# Patient Record
Sex: Male | Born: 1980 | Race: White | Hispanic: No | Marital: Single | Smoking: Current every day smoker
Health system: Southern US, Community
[De-identification: ages and names within clinical notes are randomized; demographics above are authoritative.]

## PROBLEM LIST (undated history)

## (undated) DIAGNOSIS — M5020 Other cervical disc displacement, unspecified cervical region: Secondary | ICD-10-CM

## (undated) DIAGNOSIS — F4310 Post-traumatic stress disorder, unspecified: Secondary | ICD-10-CM

## (undated) DIAGNOSIS — M5430 Sciatica, unspecified side: Secondary | ICD-10-CM

## (undated) DIAGNOSIS — S329XXA Fracture of unspecified parts of lumbosacral spine and pelvis, initial encounter for closed fracture: Secondary | ICD-10-CM

---

## 2007-08-30 ENCOUNTER — Emergency Department (HOSPITAL_COMMUNITY): Admission: EM | Admit: 2007-08-30 | Discharge: 2007-08-30 | Payer: Self-pay | Admitting: Emergency Medicine

## 2007-09-22 ENCOUNTER — Emergency Department (HOSPITAL_COMMUNITY): Admission: EM | Admit: 2007-09-22 | Discharge: 2007-09-22 | Payer: Self-pay | Admitting: Emergency Medicine

## 2007-09-25 ENCOUNTER — Emergency Department (HOSPITAL_COMMUNITY): Admission: EM | Admit: 2007-09-25 | Discharge: 2007-09-25 | Payer: Self-pay | Admitting: Emergency Medicine

## 2008-11-29 ENCOUNTER — Emergency Department (HOSPITAL_COMMUNITY): Admission: EM | Admit: 2008-11-29 | Discharge: 2008-11-29 | Payer: Self-pay | Admitting: Emergency Medicine

## 2008-12-20 ENCOUNTER — Emergency Department (HOSPITAL_COMMUNITY): Admission: EM | Admit: 2008-12-20 | Discharge: 2008-12-21 | Payer: Self-pay | Admitting: Emergency Medicine

## 2008-12-31 ENCOUNTER — Emergency Department (HOSPITAL_COMMUNITY): Admission: EM | Admit: 2008-12-31 | Discharge: 2008-12-31 | Payer: Self-pay | Admitting: Emergency Medicine

## 2009-01-03 ENCOUNTER — Emergency Department (HOSPITAL_COMMUNITY): Admission: EM | Admit: 2009-01-03 | Discharge: 2009-01-03 | Payer: Self-pay | Admitting: Emergency Medicine

## 2009-01-19 ENCOUNTER — Emergency Department (HOSPITAL_COMMUNITY): Admission: EM | Admit: 2009-01-19 | Discharge: 2009-01-19 | Payer: Self-pay | Admitting: Emergency Medicine

## 2009-02-09 ENCOUNTER — Emergency Department (HOSPITAL_COMMUNITY): Admission: EM | Admit: 2009-02-09 | Discharge: 2009-02-09 | Payer: Self-pay | Admitting: Emergency Medicine

## 2009-02-25 ENCOUNTER — Emergency Department (HOSPITAL_COMMUNITY): Admission: EM | Admit: 2009-02-25 | Discharge: 2009-02-25 | Payer: Self-pay | Admitting: Emergency Medicine

## 2009-02-27 ENCOUNTER — Emergency Department (HOSPITAL_COMMUNITY): Admission: EM | Admit: 2009-02-27 | Discharge: 2009-02-27 | Payer: Self-pay | Admitting: Emergency Medicine

## 2009-11-07 ENCOUNTER — Emergency Department (HOSPITAL_COMMUNITY): Admission: EM | Admit: 2009-11-07 | Discharge: 2009-11-07 | Payer: Self-pay | Admitting: Emergency Medicine

## 2009-11-28 ENCOUNTER — Emergency Department (HOSPITAL_COMMUNITY): Admission: EM | Admit: 2009-11-28 | Discharge: 2009-11-28 | Payer: Self-pay | Admitting: Emergency Medicine

## 2010-03-08 IMAGING — CR DG HAND COMPLETE 3+V*R*
3 series · 3 of 3 positions shown · non-contrast
Comparison: [HOSPITAL] right wrist and right hand
radiographs 09/22/2007.

CLINICAL DATA: Trauma with soft tissue swelling bruising dorsum of
right hand with pain.

RIGHT HAND - COMPLETE 3+ VIEW

[view not recorded (1 of 3)]
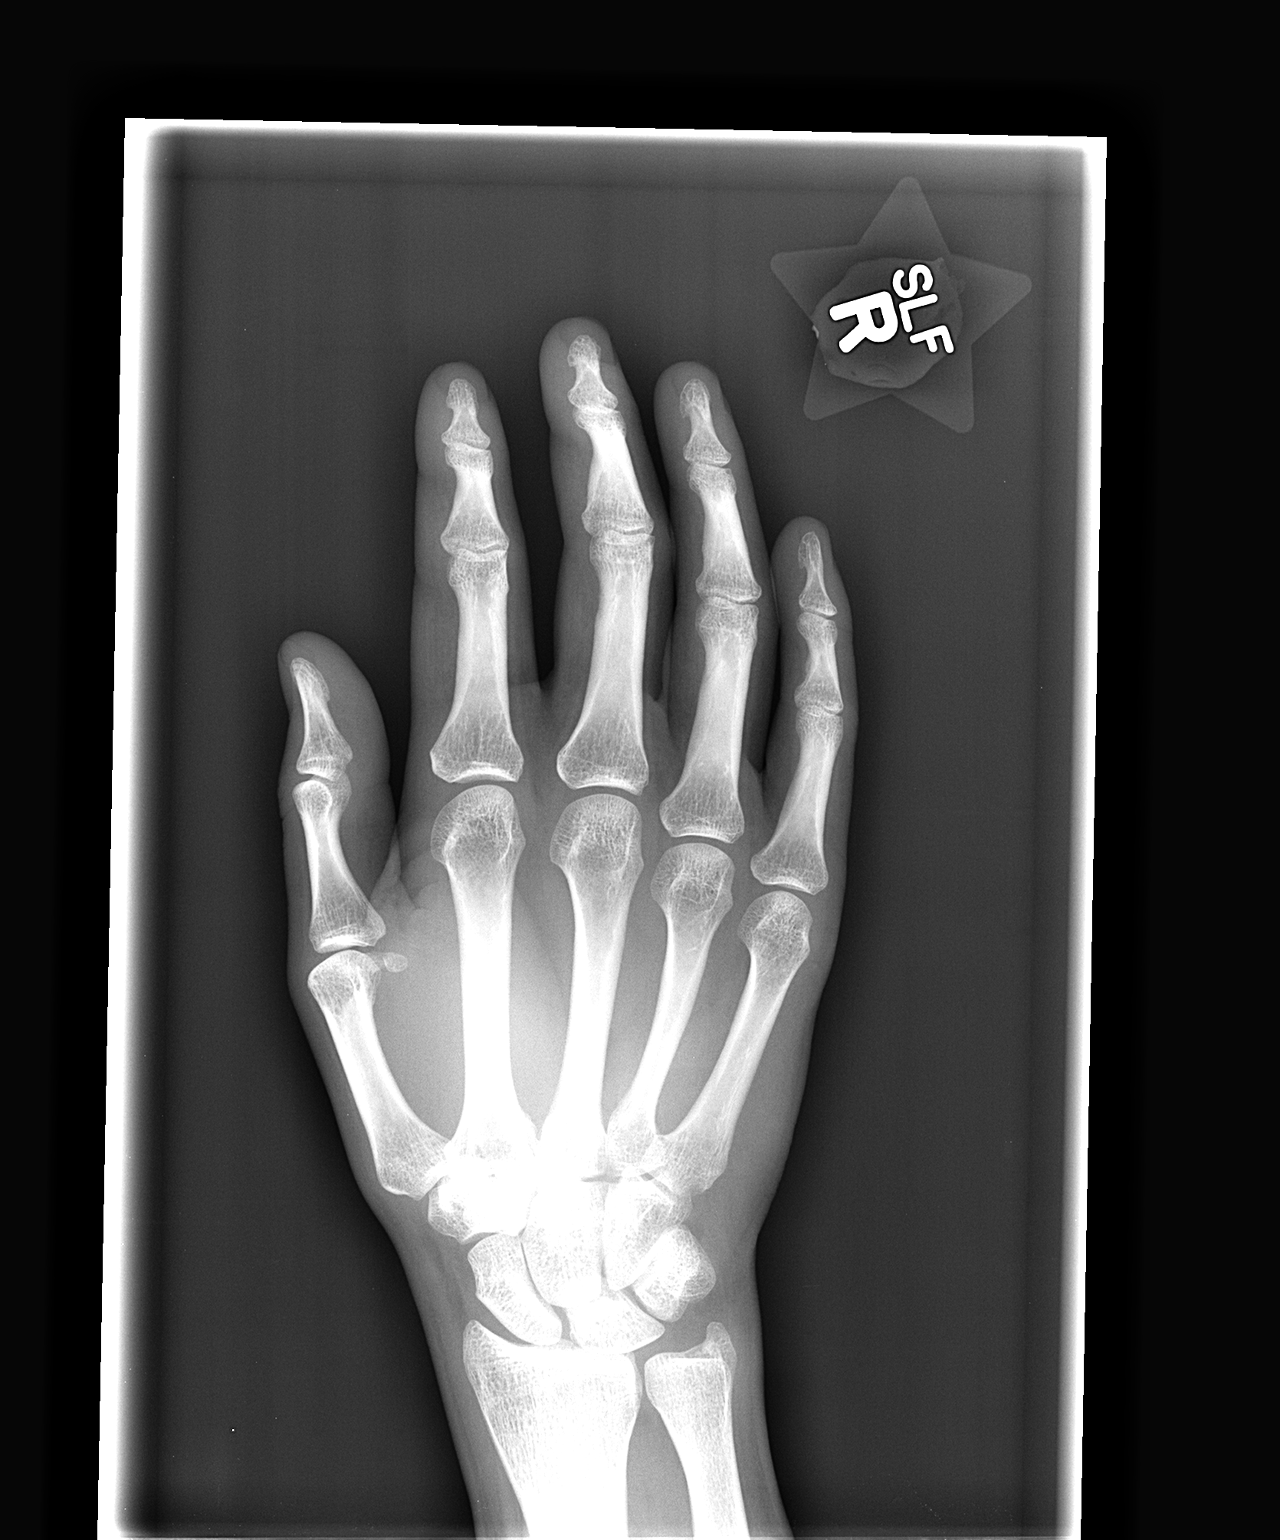

[view not recorded (2 of 3)]
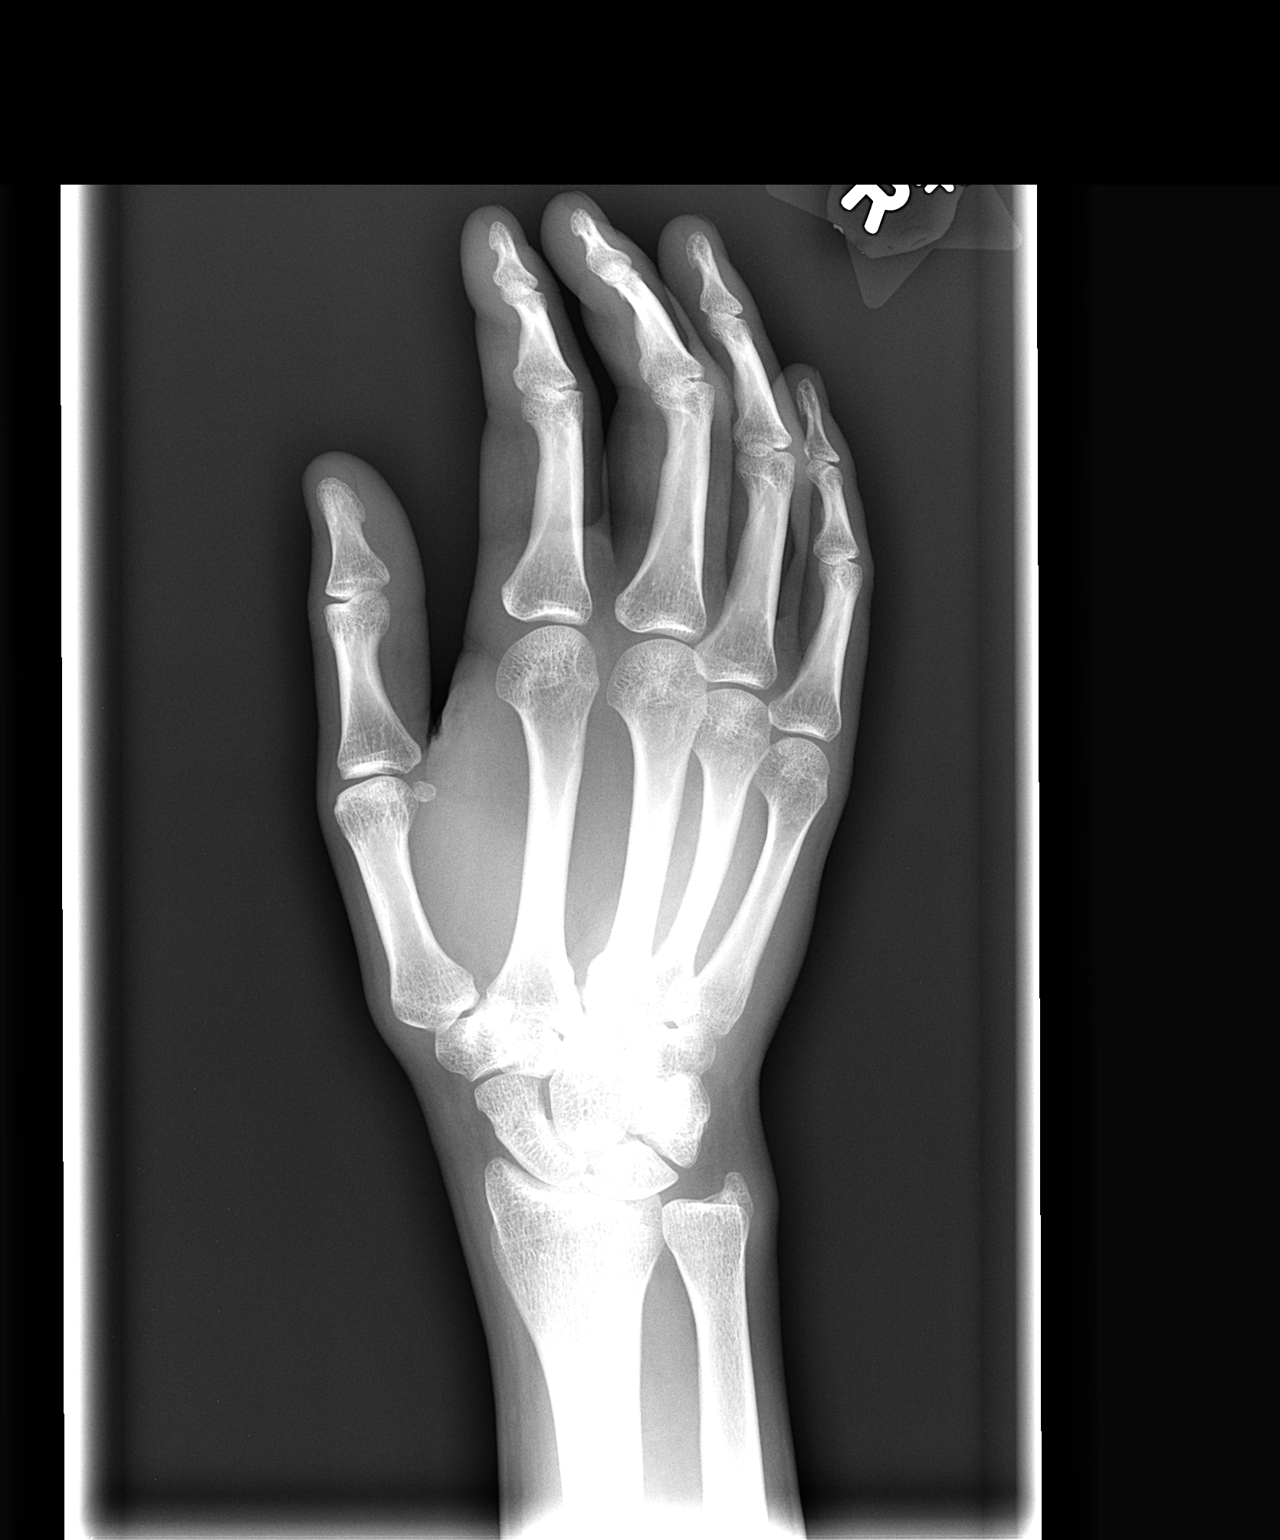

[view not recorded (3 of 3)]
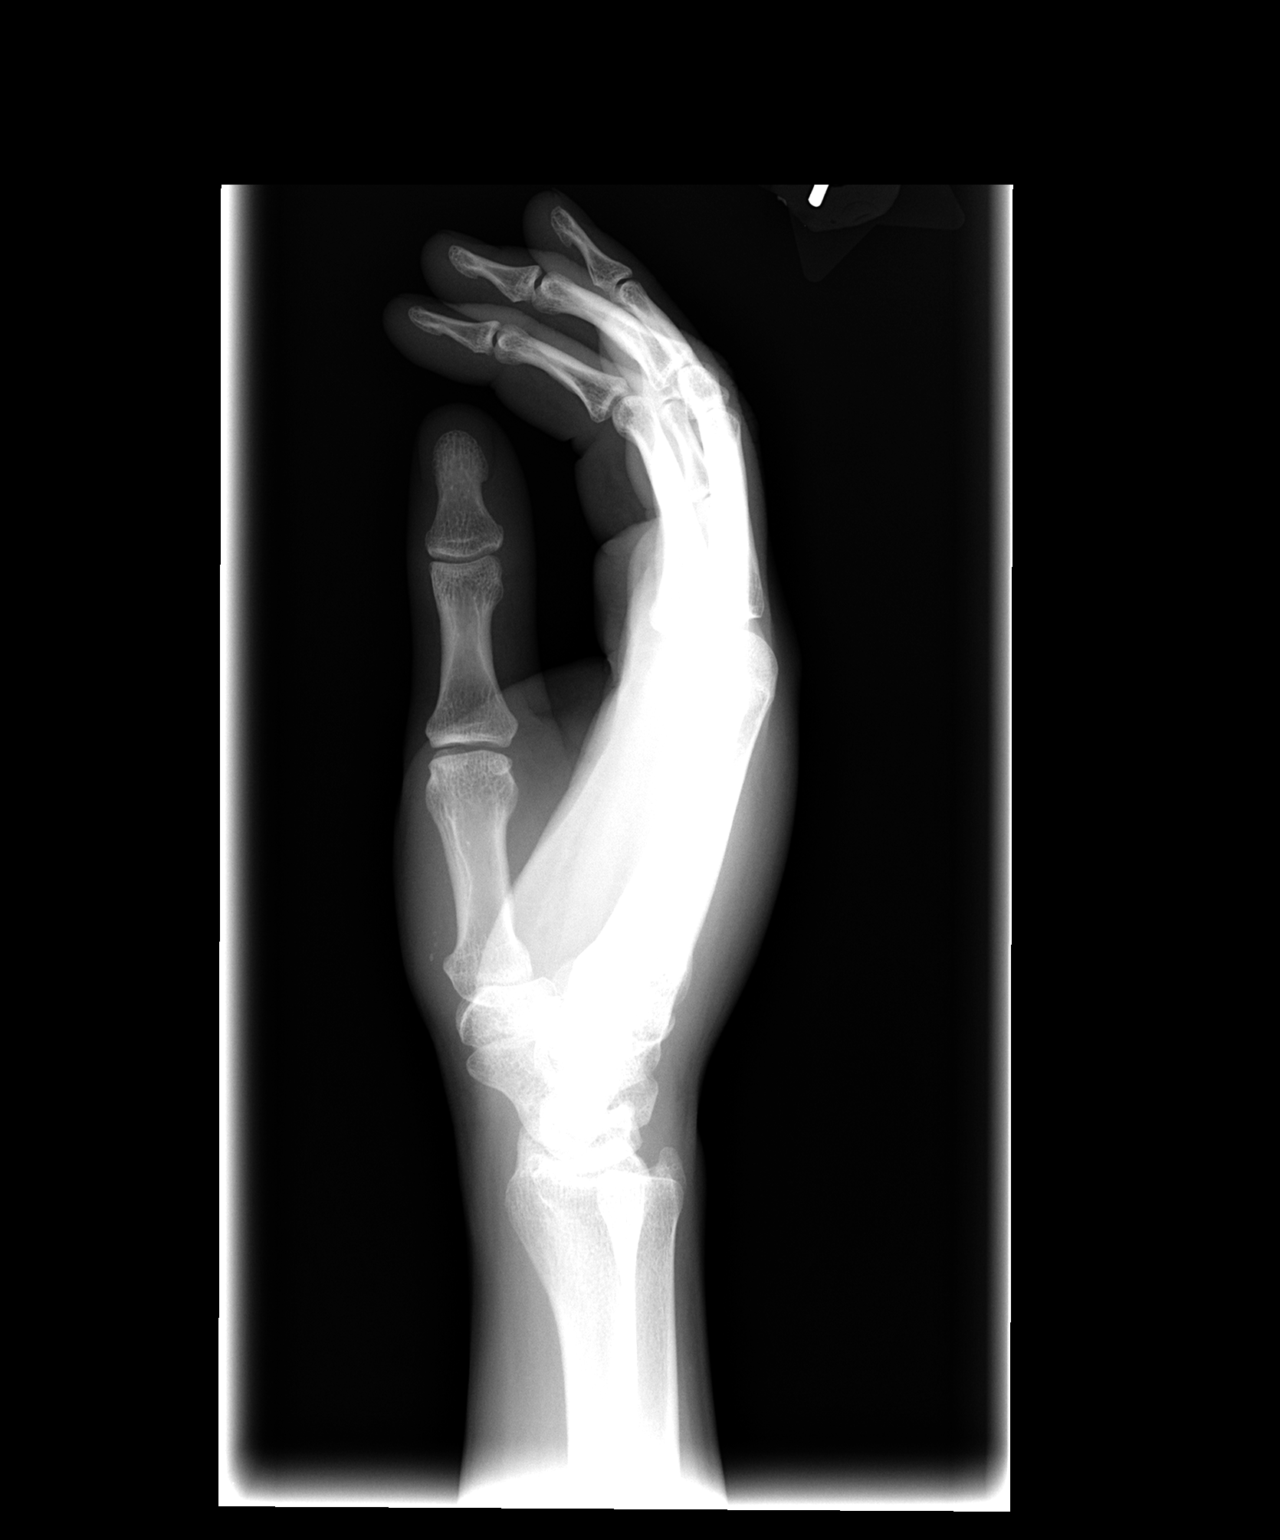

[3 of 3 positions shown; findings below may reference images not displayed]

FINDINGS: No interval acute fracture, subluxation, dislocation or
new radiopaque foreign bodies seen. Stable tiny three round
densities are seen superficial to base right first metacarpal on
the lateral view).
IMPRESSION: Stable - no interval acute findings.

## 2010-03-11 IMAGING — CR DG HAND COMPLETE 3+V*R*
3 series · 3 of 3 positions shown · non-contrast
Comparison: Plain films right hand 12/31/2008 and 09/22/2007.

CLINICAL DATA: Patient dropped a refrigerator on his hand.
Swelling and pain.

RIGHT HAND - COMPLETE 3+ VIEW

[x hand pa right]
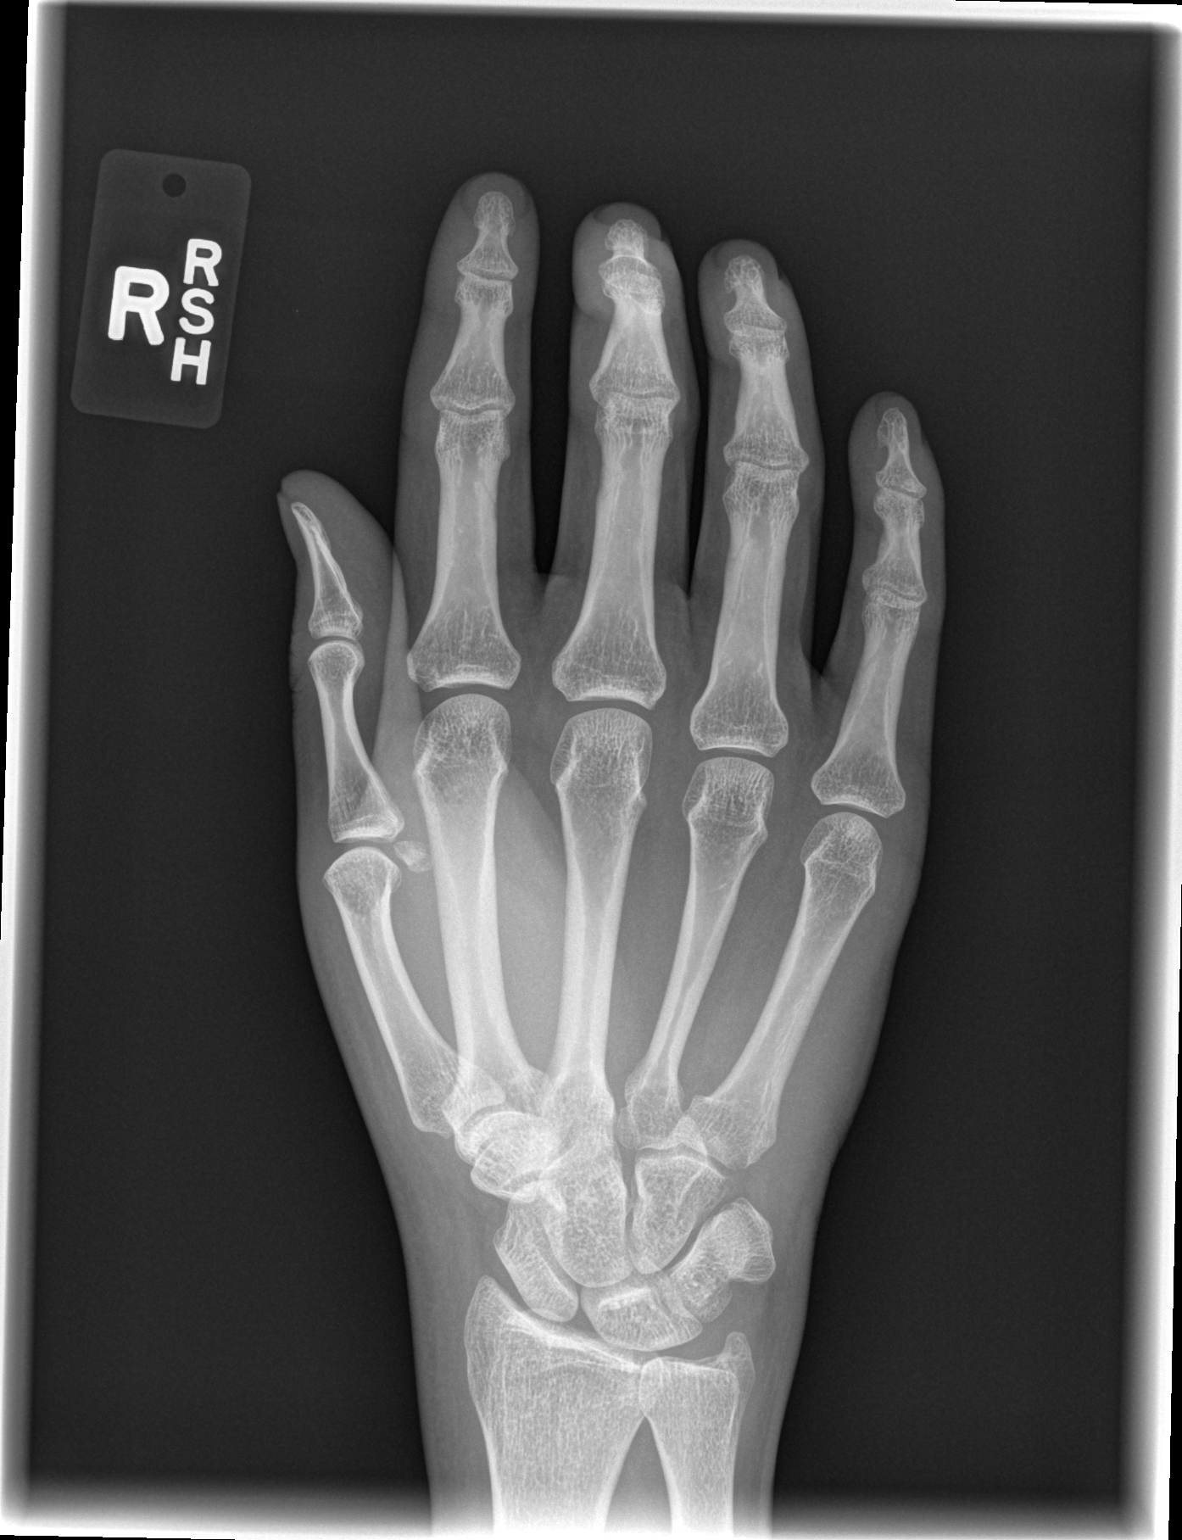

[x hand oblique right]
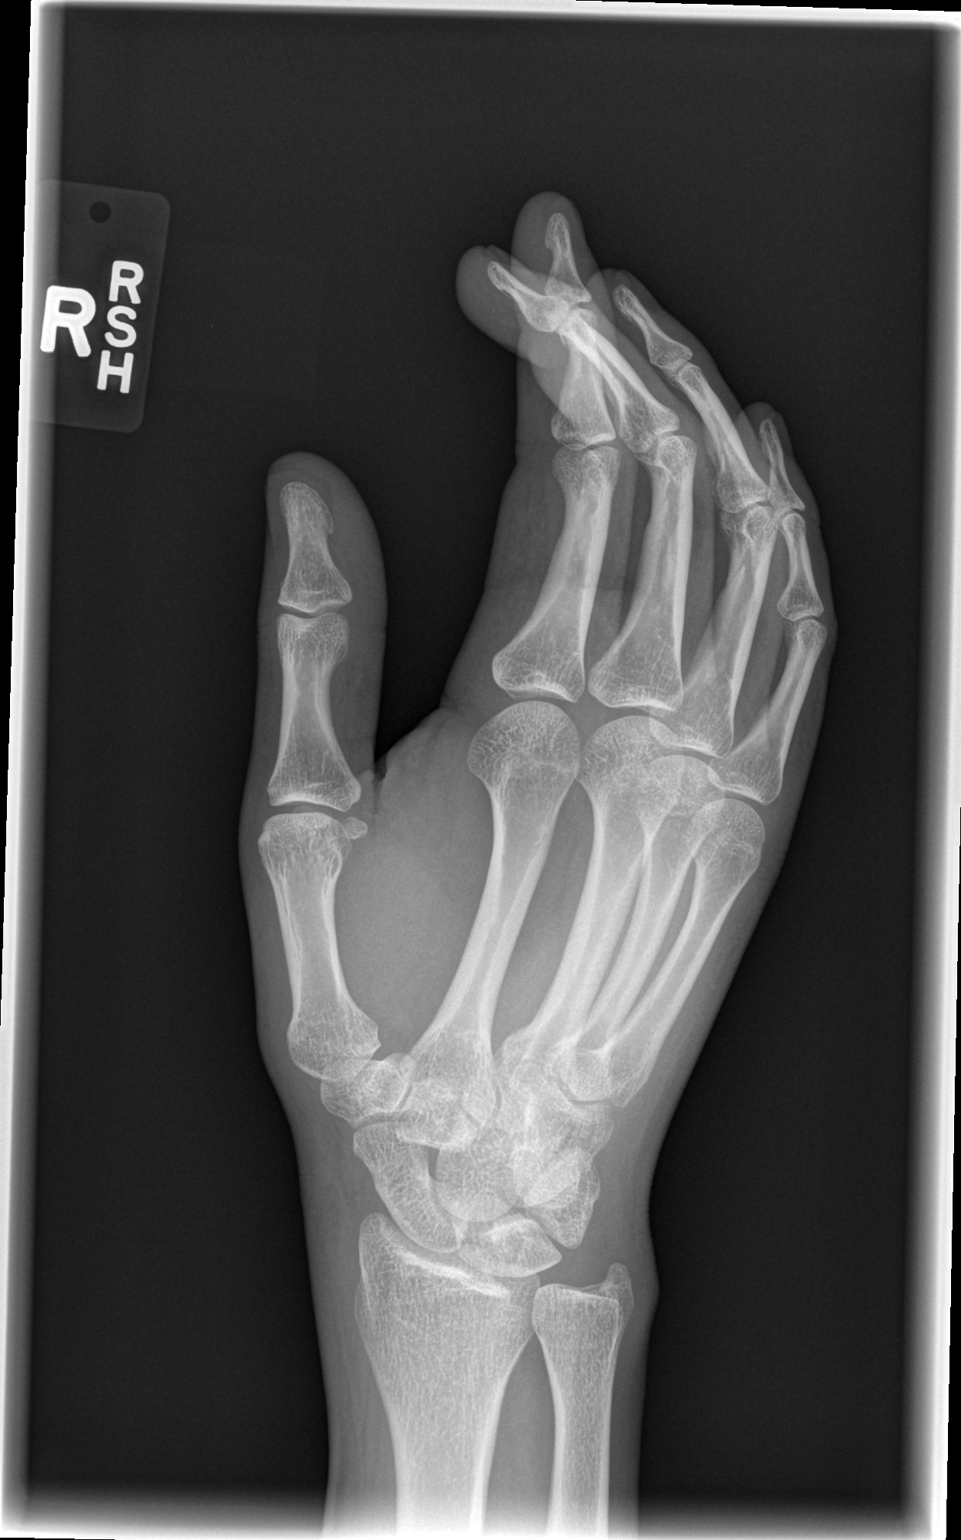

[x hand lat right]
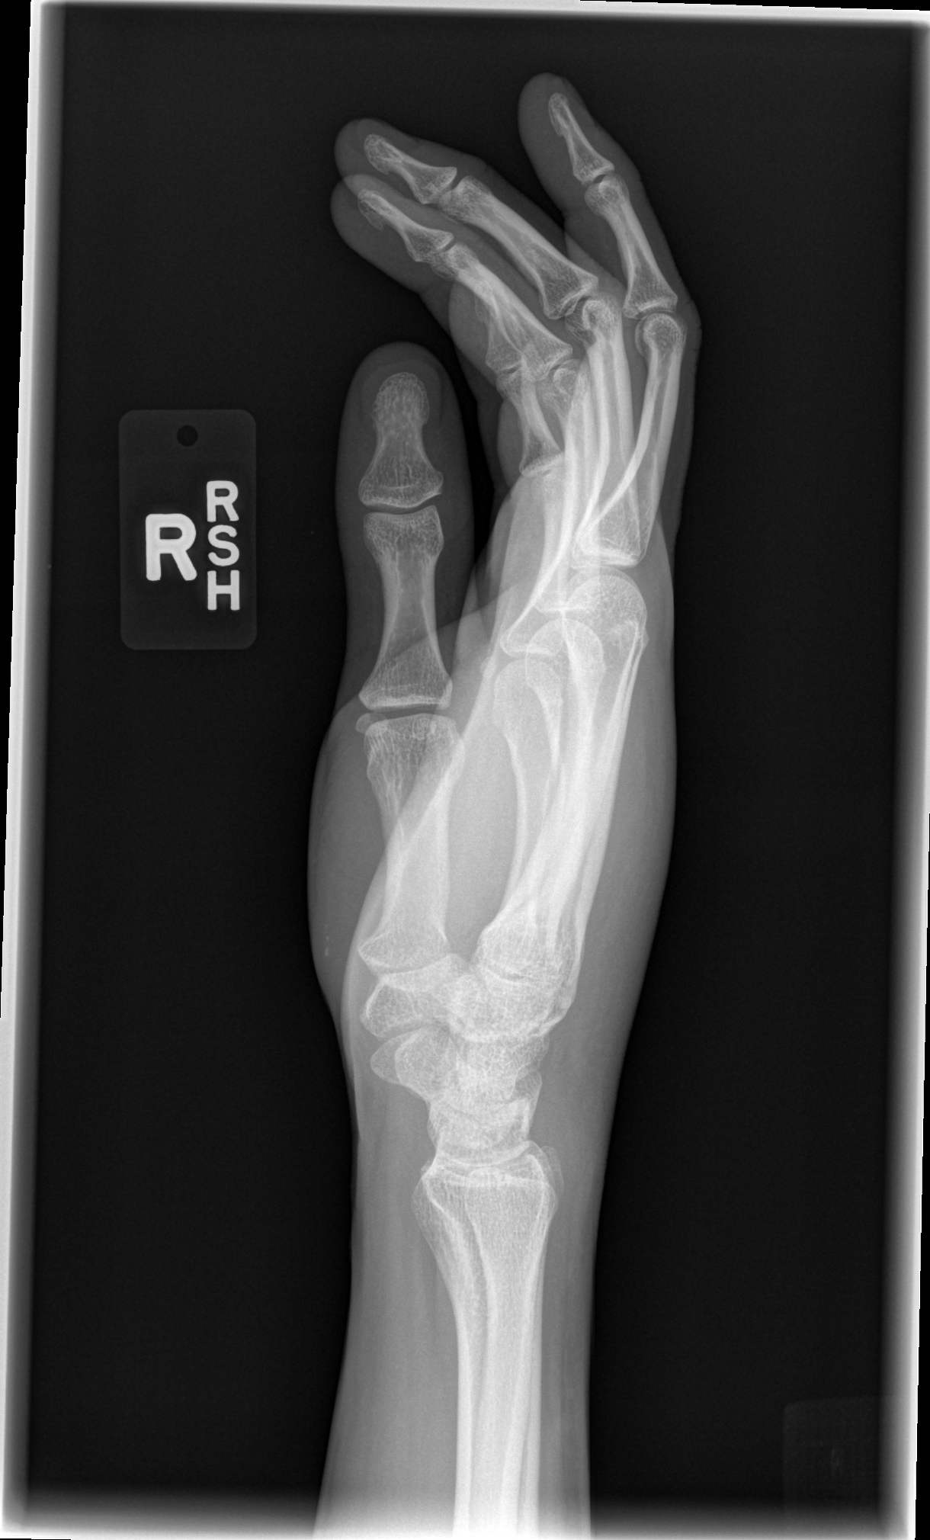

[3 of 3 positions shown; findings below may reference images not displayed]

FINDINGS: There is soft tissue swelling over the dorsum of the
hand.  There is no acute bony or joint abnormality.  Small
radiopaque densities along the thenar eminence again noted.
IMPRESSION: Soft tissue swelling without underlying fracture dislocation.

## 2010-04-01 LAB — CBC
MCHC: 35 g/dL (ref 30.0–36.0)
MCV: 94.5 fL (ref 78.0–100.0)
Platelets: 251 10*3/uL (ref 150–400)
RDW: 13 % (ref 11.5–15.5)
WBC: 7.7 10*3/uL (ref 4.0–10.5)

## 2010-04-01 LAB — DIFFERENTIAL
Basophils Absolute: 0.1 10*3/uL (ref 0.0–0.1)
Basophils Relative: 1 % (ref 0–1)
Eosinophils Relative: 2 % (ref 0–5)
Lymphocytes Relative: 32 % (ref 12–46)
Neutro Abs: 4.4 10*3/uL (ref 1.7–7.7)

## 2012-09-17 ENCOUNTER — Emergency Department (HOSPITAL_COMMUNITY): Payer: Self-pay

## 2012-09-17 ENCOUNTER — Emergency Department (HOSPITAL_COMMUNITY)
Admission: EM | Admit: 2012-09-17 | Discharge: 2012-09-17 | Disposition: A | Discharge status: Home / Self Care | Payer: Self-pay | Attending: Emergency Medicine | Admitting: Emergency Medicine

## 2012-09-17 ENCOUNTER — Encounter (HOSPITAL_COMMUNITY): Payer: Self-pay | Admitting: *Deleted

## 2012-09-17 DIAGNOSIS — R6883 Chills (without fever): Secondary | ICD-10-CM | POA: Insufficient documentation

## 2012-09-17 DIAGNOSIS — Y9389 Activity, other specified: Secondary | ICD-10-CM | POA: Insufficient documentation

## 2012-09-17 DIAGNOSIS — Y9289 Other specified places as the place of occurrence of the external cause: Secondary | ICD-10-CM | POA: Insufficient documentation

## 2012-09-17 DIAGNOSIS — S93609A Unspecified sprain of unspecified foot, initial encounter: Secondary | ICD-10-CM | POA: Insufficient documentation

## 2012-09-17 DIAGNOSIS — S93602A Unspecified sprain of left foot, initial encounter: Secondary | ICD-10-CM

## 2012-09-17 DIAGNOSIS — X58XXXA Exposure to other specified factors, initial encounter: Secondary | ICD-10-CM | POA: Insufficient documentation

## 2012-09-17 DIAGNOSIS — F17200 Nicotine dependence, unspecified, uncomplicated: Secondary | ICD-10-CM | POA: Insufficient documentation

## 2012-09-17 NOTE — ED Notes (Signed)
Pt states he was at the pool, does not know what he did to his left foot, swollen and can not  Bend toes.

## 2012-09-17 NOTE — ED Provider Notes (Signed)
Medical screening examination/treatment/procedure(s) were performed by non-physician practitioner and as supervising physician I was immediately available for consultation/collaboration.  Elmarie Devlin L Sharniece Gibbon, MD 09/17/12 2324 

## 2012-09-17 NOTE — ED Provider Notes (Signed)
CSN: [id number]     Arrival date & time 09/17/12  1924 History  This chart was scribed for non-physician practitioner, Johnnette Gourd, working with Flint Melter, MD, by Karle Plumber, ED Scribe.  This patient was seen in room WTR8/WTR8 and the patient's care was started at 7:50 PM.    Chief Complaint  Patient presents with  . Foot Injury    The history is provided by the patient. No language interpreter was used.   HPI Comments:  Maurice Stewart is a 32 y.o. male who presents to the Emergency Department complaining of constant, moderate, throbbing left foot pain onset today. Pt states that he has been drinking all [place] at the pool and he is not sure what onset the pain, but states he was playing "slam dunk". He states that he does not remember any specific injury. He states that he has had nothing for pain, but that he has used alcohol for pain relief today. He states that he is not able to fully move his toes and that he has some mild numbness to the foot.   History reviewed. No pertinent past medical history. History reviewed. No pertinent past surgical history. No family history on file. History  Substance Use Topics  . Smoking status: Current Every [place] Smoker  . Smokeless tobacco: Not on file  . Alcohol Use: Yes    Review of Systems  Constitutional: Positive for chills. Negative for fever.  Gastrointestinal: Negative for nausea and vomiting.  Musculoskeletal:       Foot pain due.  All other systems reviewed and are negative.   Allergies  Review of patient's allergies indicates no known allergies.  Home Medications  No current outpatient prescriptions on file. Triage Vitals:  BP 120/74  Pulse 72  Temp(Src) 98.8 F (37.1 C) (Oral)  Resp 18  SpO2 99% Physical Exam  Nursing note and vitals reviewed. Constitutional: He is oriented to person, place, and time. He appears well-developed and well-nourished. No distress.  HENT:  Head: Normocephalic and atraumatic.  Eyes:  Conjunctivae and EOM are normal.  Neck: Normal range of motion. Neck supple.  Cardiovascular: Normal rate, regular rhythm, normal heart sounds and intact distal pulses.   Cap refill less than 3 seconds.  Pulmonary/Chest: Effort normal and breath sounds normal.  Musculoskeletal: Normal range of motion. He exhibits no edema.  Mild swelling and tenderness to palpation of second, third, and fourth metatarsals of left foot. No ankle tenderness or swelling. No deformity.  Neurological: He is alert and oriented to person, place, and time.  Skin: Skin is warm and dry.  Psychiatric: He has a normal mood and affect. His behavior is normal.    ED Course  Procedures (including critical care time) DIAGNOSTIC STUDIES: Oxygen Saturation is 99% on RA, normal by my interpretation.   COORDINATION OF CARE: 7:50 PM-Pt advised of plan to X-ray his foot. Pt verbalizes understanding and agrees to plan.  Labs Review Labs Reviewed - No data to display Imaging Review Dg Foot Complete Left  09/17/2012   *RADIOLOGY REPORT*  Clinical Data: Blow to the left foot.  Pain.  LEFT FOOT - COMPLETE 3+ VIEW  Comparison: None.  Findings: Imaged bones, joints and soft tissues appear normal.  IMPRESSION: Negative exam.   Original Report Authenticated By: Holley Dexter, M.D.    MDM   1. Foot sprain, left, initial encounter     Patient with foot injury after playing "slam-dunk" in the pool. Xray without acute abnormality. Neurovascularly intact. ACE  wrap and crutches. Advised rest, ice, elevation, ibuprofen. Patient states understanding of treatment care plan and is agreeable.    I personally performed the services described in this documentation, which was scribed in my presence. The recorded information has been reviewed and is accurate.   Trevor Mace, PA-C 09/17/12 1956

## 2012-09-18 ENCOUNTER — Emergency Department (HOSPITAL_COMMUNITY)
Admission: EM | Admit: 2012-09-18 | Discharge: 2012-09-18 | Disposition: A | Discharge status: Home / Self Care | Payer: Self-pay | Attending: Emergency Medicine | Admitting: Emergency Medicine

## 2012-09-18 ENCOUNTER — Encounter (HOSPITAL_COMMUNITY): Payer: Self-pay | Admitting: *Deleted

## 2012-09-18 ENCOUNTER — Emergency Department (HOSPITAL_COMMUNITY): Payer: Self-pay

## 2012-09-18 DIAGNOSIS — S93609A Unspecified sprain of unspecified foot, initial encounter: Secondary | ICD-10-CM | POA: Insufficient documentation

## 2012-09-18 DIAGNOSIS — Y9289 Other specified places as the place of occurrence of the external cause: Secondary | ICD-10-CM | POA: Insufficient documentation

## 2012-09-18 DIAGNOSIS — S93602A Unspecified sprain of left foot, initial encounter: Secondary | ICD-10-CM

## 2012-09-18 DIAGNOSIS — F17200 Nicotine dependence, unspecified, uncomplicated: Secondary | ICD-10-CM | POA: Insufficient documentation

## 2012-09-18 DIAGNOSIS — Y9389 Activity, other specified: Secondary | ICD-10-CM | POA: Insufficient documentation

## 2012-09-18 DIAGNOSIS — R296 Repeated falls: Secondary | ICD-10-CM | POA: Insufficient documentation

## 2012-09-18 NOTE — ED Provider Notes (Signed)
CSN: [id number]     Arrival date & time 09/18/12  1191 History   First MD Initiated Contact with Patient 09/18/12 1003     Chief Complaint  Patient presents with  . Foot Pain   (Consider location/radiation/quality/duration/timing/severity/associated sxs/prior Treatment) The history is provided by the patient. No language interpreter was used.  Maurice Stewart is a 32 year old male presenting to the emergency department with left foot pain. Patient reported that last night he was playing basketball intoxicated, patient reported that as he went to go slam dunk he does not know what happened and landed on his left foot in an awkward manner. Patient reported that he woke up this morning with pain to the top portion of the left foot described as a constant throbbing sensation without radiation. Patient reported that the pain is worse when he bears weight on the left foot, reported that when he bears weight on the left foot there is mild pain radiating to the distal portion of the left in. Patient reported that he's been using Tylenol as needed and Motrin as needed, has been using ice with elevation. Reported that nothing makes the pain better. Patient denied tingling, numbness, loss of sensation, head injury, loss of consciousness, blurred vision, dizziness, injury to the foot before. PCP none  History reviewed. No pertinent past medical history. History reviewed. No pertinent past surgical history. No family history on file. History  Substance Use Topics  . Smoking status: Current Every [place] Smoker -- 0.25 packs/[place]    Types: Cigarettes  . Smokeless tobacco: Not on file  . Alcohol Use: Yes    Review of Systems  Respiratory: Negative for shortness of breath.   Cardiovascular: Negative for chest pain.  Musculoskeletal: Positive for arthralgias (left foot).  Neurological: Positive for numbness. Negative for weakness.  All other systems reviewed and are negative.    Allergies  Review of patient's  allergies indicates no known allergies.  Home Medications  No current outpatient prescriptions on file. BP 131/79  Pulse 100  Temp(Src) 97.8 F (36.6 C) (Oral)  Resp 16  Ht 6\' 2"  (1.88 m)  Wt 170 lb (77.111 kg)  BMI 21.82 kg/m2  SpO2 97% Physical Exam  Nursing note and vitals reviewed. Constitutional: He is oriented to person, place, and time. He appears well-developed and well-nourished. No distress.  HENT:  Head: Normocephalic and atraumatic.  Neck: Normal range of motion. Neck supple.  Cardiovascular: Normal rate, regular rhythm and normal heart sounds.  Exam reveals no friction rub.   No murmur heard. Pulses:      Radial pulses are 2+ on the right side, and 2+ on the left side.       Dorsalis pedis pulses are 2+ on the right side, and 2+ on the left side.  Pulmonary/Chest: Effort normal and breath sounds normal. No respiratory distress. He has no wheezes. He has no rales.  Musculoskeletal: He exhibits tenderness.  Negative swelling, deformities, lesions, sores noted to the left foot. Ecchymosis noted to the dorsal aspect of the left foot. Full range of motion to the left ankle. Patient able to wiggle toes, mild decrease in flexion of the left great toe secondary to pain.  Neurological: He is alert and oriented to person, place, and time. He exhibits normal muscle tone. Coordination normal.  Sensation intact to left lower extremity with differentiation to sharp and dull touch Strength 4+/5+ to left lower extremity with resistance, discomfort noted secondary to pain  Skin: Skin is warm and dry. No  rash noted. He is not diaphoretic. No erythema.  Superficial abrasion noted to the right leg just beneath the knee, bleeding controlled  Psychiatric: He has a normal mood and affect. His behavior is normal. Thought content normal.    ED Course  Procedures (including critical care time) Labs Review Labs Reviewed - No data to display Imaging Review Dg Foot Complete Left  09/18/2012    *RADIOLOGY REPORT*  Clinical Data: Injury, left foot pain.  LEFT FOOT - COMPLETE 3+ VIEW  Comparison: Plain films 09/17/2012.  Findings: Imaged bones, joints and soft tissues appear normal.  IMPRESSION: Negative exam.   Original Report Authenticated By: Holley Dexter, M.D.    MDM   1. Foot sprain, left, initial encounter     Patient presenting to the emergency department with left foot pain that started this morning. Alert and oriented. Ecchymosis noted to the dorsal aspect of the left foot, negative signs of swelling or deformities noted. Patient is able to move left ankle fully without complications or discomfort noted. Patient able to wiggle toes, discomfort with flexion of the great toe of the left foot. Strength intact for the most part, discomfort noted. Pulses palpable. Sensation intact. Negative neurological deficits noted. Imaging of the left foot negative for acute injuries. Suspicion to be left foot sprain patient placed in foot brace. Crutches given for comfort. Discussed with patient to rest, ice, elevate. Discussed with patient to use ibuprofen as needed for discomfort. Referred to orthopedics the pain is to worsen. Discussed with patient to continue monitor symptoms and if symptoms are to worsen or change to report back to emergency department - return instructions given. Patient agreed to plan of care, understood, all questions answered.    Raymon Mutton, PA-C 09/20/12 [phone]

## 2012-09-18 NOTE — ED Notes (Signed)
Pt was playing b-ball in pool yesterday - some etoh.  Today woke up with pain and bruising to dorsum of F foot.

## 2012-09-20 NOTE — ED Provider Notes (Signed)
Medical screening examination/treatment/procedure(s) were performed by non-physician practitioner and as supervising physician I was immediately available for consultation/collaboration.   Valyn Latchford M Nalani Andreen, MD 09/20/12 0717 

## 2013-09-08 ENCOUNTER — Emergency Department (HOSPITAL_BASED_OUTPATIENT_CLINIC_OR_DEPARTMENT_OTHER): Payer: Self-pay

## 2013-09-08 ENCOUNTER — Encounter (HOSPITAL_BASED_OUTPATIENT_CLINIC_OR_DEPARTMENT_OTHER): Payer: Self-pay | Admitting: Emergency Medicine

## 2013-09-08 ENCOUNTER — Emergency Department (HOSPITAL_BASED_OUTPATIENT_CLINIC_OR_DEPARTMENT_OTHER)
Admission: EM | Admit: 2013-09-08 | Discharge: 2013-09-08 | Disposition: A | Discharge status: Home / Self Care | Payer: Self-pay | Attending: Emergency Medicine | Admitting: Emergency Medicine

## 2013-09-08 DIAGNOSIS — R209 Unspecified disturbances of skin sensation: Secondary | ICD-10-CM | POA: Insufficient documentation

## 2013-09-08 DIAGNOSIS — R51 Headache: Secondary | ICD-10-CM | POA: Insufficient documentation

## 2013-09-08 DIAGNOSIS — M542 Cervicalgia: Secondary | ICD-10-CM | POA: Insufficient documentation

## 2013-09-08 DIAGNOSIS — R42 Dizziness and giddiness: Secondary | ICD-10-CM | POA: Insufficient documentation

## 2013-09-08 DIAGNOSIS — F17200 Nicotine dependence, unspecified, uncomplicated: Secondary | ICD-10-CM | POA: Insufficient documentation

## 2013-09-08 DIAGNOSIS — R630 Anorexia: Secondary | ICD-10-CM | POA: Insufficient documentation

## 2013-09-08 DIAGNOSIS — R112 Nausea with vomiting, unspecified: Secondary | ICD-10-CM | POA: Insufficient documentation

## 2013-09-08 DIAGNOSIS — M549 Dorsalgia, unspecified: Secondary | ICD-10-CM | POA: Insufficient documentation

## 2013-09-08 DIAGNOSIS — R6883 Chills (without fever): Secondary | ICD-10-CM | POA: Insufficient documentation

## 2013-09-08 DIAGNOSIS — J0110 Acute frontal sinusitis, unspecified: Secondary | ICD-10-CM | POA: Insufficient documentation

## 2013-09-08 DIAGNOSIS — G4489 Other headache syndrome: Secondary | ICD-10-CM | POA: Insufficient documentation

## 2013-09-08 DIAGNOSIS — R519 Headache, unspecified: Secondary | ICD-10-CM

## 2013-09-08 LAB — CBC WITH DIFFERENTIAL/PLATELET
Basophils Absolute: 0.1 10*3/uL (ref 0.0–0.1)
Basophils Relative: 0 % (ref 0–1)
EOS ABS: 0.2 10*3/uL (ref 0.0–0.7)
EOS PCT: 1 % (ref 0–5)
HCT: 46.8 % (ref 39.0–52.0)
HEMOGLOBIN: 15.9 g/dL (ref 13.0–17.0)
LYMPHS ABS: 4.7 10*3/uL — AB (ref 0.7–4.0)
Lymphocytes Relative: 33 % (ref 12–46)
MCH: 32.8 pg (ref 26.0–34.0)
MCHC: 34 g/dL (ref 30.0–36.0)
MCV: 96.5 fL (ref 78.0–100.0)
MONO ABS: 1.2 10*3/uL — AB (ref 0.1–1.0)
MONOS PCT: 8 % (ref 3–12)
Neutro Abs: 8 10*3/uL — ABNORMAL HIGH (ref 1.7–7.7)
Neutrophils Relative %: 57 % (ref 43–77)
Platelets: 289 10*3/uL (ref 150–400)
RBC: 4.85 MIL/uL (ref 4.22–5.81)
RDW: 12.6 % (ref 11.5–15.5)
WBC: 14.2 10*3/uL — ABNORMAL HIGH (ref 4.0–10.5)

## 2013-09-08 LAB — CSF CELL COUNT WITH DIFFERENTIAL
RBC COUNT CSF: 14 /mm3 — AB
RBC Count, CSF: 0 /mm3
TUBE #: 1
TUBE #: 4
WBC, CSF: 1 /mm3 (ref 0–5)
WBC, CSF: 1 /mm3 (ref 0–5)

## 2013-09-08 LAB — GLUCOSE, CSF: Glucose, CSF: 73 mg/dL (ref 43–76)

## 2013-09-08 LAB — SEDIMENTATION RATE: Sed Rate: 2 mm/hr (ref 0–16)

## 2013-09-08 LAB — BASIC METABOLIC PANEL
Anion gap: 13 (ref 5–15)
BUN: 26 mg/dL — AB (ref 6–23)
CHLORIDE: 98 meq/L (ref 96–112)
CO2: 28 mEq/L (ref 19–32)
Calcium: 10.4 mg/dL (ref 8.4–10.5)
Creatinine, Ser: 1.3 mg/dL (ref 0.50–1.35)
GFR calc Af Amer: 83 mL/min — ABNORMAL LOW (ref >90)
GFR, EST NON AFRICAN AMERICAN: 72 mL/min — AB (ref >90)
GLUCOSE: 111 mg/dL — AB (ref 70–99)
POTASSIUM: 4 meq/L (ref 3.7–5.3)
Sodium: 139 mEq/L (ref 137–147)

## 2013-09-08 LAB — PROTEIN, CSF: Total  Protein, CSF: 25 mg/dL (ref 15–45)

## 2013-09-08 MED ORDER — HYDROCODONE-ACETAMINOPHEN 5-325 MG PO TABS: 1.0000 | ORAL_TABLET | ORAL | Status: DC | PRN

## 2013-09-08 MED ORDER — METOCLOPRAMIDE HCL 5 MG/ML IJ SOLN: 5.0000 mg | Freq: Once | INTRAMUSCULAR | Status: DC

## 2013-09-08 MED ORDER — HYDROMORPHONE HCL PF 1 MG/ML IJ SOLN
1.0000 mg | Freq: Once | INTRAMUSCULAR | Status: AC
  Administered 2013-09-08: 1 mg via INTRAVENOUS

## 2013-09-08 MED ORDER — LIDOCAINE HCL 2 % IJ SOLN
INTRAMUSCULAR | Status: AC
  Administered 2013-09-08: 400 mg via INTRADERMAL
  Filled 2013-09-08: qty 20

## 2013-09-08 MED ORDER — SODIUM CHLORIDE 0.9 % IV BOLUS (SEPSIS)
1000.0000 mL | Freq: Once | INTRAVENOUS | Status: AC
  Administered 2013-09-08: 1000 mL via INTRAVENOUS

## 2013-09-08 MED ORDER — HYDROCODONE-ACETAMINOPHEN 5-325 MG PO TABS
2.0000 | ORAL_TABLET | Freq: Once | ORAL | Status: AC
  Administered 2013-09-08: 2 via ORAL
  Filled 2013-09-08: qty 2

## 2013-09-08 MED ORDER — HYDROMORPHONE HCL PF 1 MG/ML IJ SOLN
INTRAMUSCULAR | Status: AC
  Administered 2013-09-08: 1 mg via INTRAVENOUS
  Filled 2013-09-08: qty 1

## 2013-09-08 MED ORDER — FENTANYL CITRATE 0.05 MG/ML IJ SOLN
50.0000 ug | INTRAMUSCULAR | Status: DC | PRN
  Administered 2013-09-08 (×3): 50 ug via INTRAVENOUS
  Filled 2013-09-08 (×3): qty 2

## 2013-09-08 MED ORDER — DIPHENHYDRAMINE HCL 50 MG/ML IJ SOLN
25.0000 mg | Freq: Once | INTRAMUSCULAR | Status: AC
  Administered 2013-09-08: 25 mg via INTRAVENOUS
  Filled 2013-09-08: qty 1

## 2013-09-08 MED ORDER — KETOROLAC TROMETHAMINE 30 MG/ML IJ SOLN
30.0000 mg | Freq: Once | INTRAMUSCULAR | Status: AC
  Administered 2013-09-08: 30 mg via INTRAVENOUS
  Filled 2013-09-08: qty 1

## 2013-09-08 MED ORDER — LIDOCAINE HCL (PF) 2 % IJ SOLN
10.0000 mL | Freq: Once | INTRAMUSCULAR | Status: AC
  Administered 2013-09-08: 400 mg via INTRADERMAL
  Filled 2013-09-08: qty 10

## 2013-09-08 MED ORDER — METOCLOPRAMIDE HCL 5 MG/ML IJ SOLN
10.0000 mg | Freq: Once | INTRAMUSCULAR | Status: AC
  Administered 2013-09-08: 10 mg via INTRAVENOUS
  Filled 2013-09-08: qty 2

## 2013-09-08 MED ORDER — IBUPROFEN 800 MG PO TABS: 800.0000 mg | ORAL_TABLET | Freq: Three times a day (TID) | ORAL | Status: DC

## 2013-09-08 NOTE — ED Notes (Signed)
Patient transported to CT 

## 2013-09-08 NOTE — ED Notes (Signed)
MD at bedside performing lumbar puncture.

## 2013-09-08 NOTE — ED Notes (Signed)
MD at bedside. 

## 2013-09-08 NOTE — ED Notes (Signed)
Waiting on gram stain results. [facility] has called the lab to inquire about the results. Also received a call from the lab at appx 11:15am and the test had not resulted at that time.

## 2013-09-08 NOTE — Discharge Instructions (Signed)
General Headache Without Cause A headache is pain or discomfort felt around the head or neck area. The specific cause of a headache may not be found. There are many causes and types of headaches. A few common ones are:  Tension headaches.  Migraine headaches.  Cluster headaches.  Chronic daily headaches. HOME CARE INSTRUCTIONS   Keep all follow-up appointments with your caregiver or any specialist referral.  Only take over-the-counter or prescription medicines for pain or discomfort as directed by your caregiver.  Lie down in a dark, quiet room when you have a headache.  Keep a headache journal to find out what may trigger your migraine headaches. For example, write down:  What you eat and drink.  How much sleep you get.  Any change to your diet or medicines.  Try massage or other relaxation techniques.  Put ice packs or heat on the head and neck. Use these 3 to 4 times per [place] for 15 to 20 minutes each time, or as needed.  Limit stress.  Sit up straight, and do not tense your muscles.  Quit smoking if you smoke.  Limit alcohol use.  Decrease the amount of caffeine you drink, or stop drinking caffeine.  Eat and sleep on a regular schedule.  Get 7 to 9 hours of sleep, or as recommended by your caregiver.  Keep lights dim if bright lights bother you and make your headaches worse. SEEK MEDICAL CARE IF:   You have problems with the medicines you were prescribed.  Your medicines are not working.  You have a change from the usual headache.  You have nausea or vomiting. SEEK IMMEDIATE MEDICAL CARE IF:   Your headache becomes severe.  You have a fever.  You have a stiff neck.  You have loss of vision.  You have muscular weakness or loss of muscle control.  You start losing your balance or have trouble walking.  You feel faint or pass out.  You have severe symptoms that are different from your first symptoms. MAKE SURE YOU:   Understand these  instructions.  Will watch your condition.  Will get help right away if you are not doing well or get worse. Document Released: 01/04/2005 Document Revised: 03/29/2011 Document Reviewed: 01/20/2011 [facility] Patient Information 2015 [place], [place]. This information is not intended to replace advice given to you by your health care provider. Make sure you discuss any questions you have with your health care provider.    Lumbar Puncture A lumbar puncture, or spinal tap, is a procedure in which a small amount of the fluid that surrounds the brain and spinal cord is removed and examined. The fluid is called the cerebrospinal fluid. This procedure may be done to:   Help diagnose various problems, such as meningitis, encephalitis, multiple sclerosis, and AIDS.   Remove fluid and relieve pressure that occurs with certain types of headaches.   Look for bleeding within the brain and spinal cord areas (central nervous system).   Place medicine into the spinal fluid.  LET [facility] HospitalYOUR [facility]ALTH CARE PROVIDER KNOW ABOUT:  Any allergies you have.  All medicines you are taking, including vitamins, herbs, eye drops, creams, and over-the-counter medicines.  Previous problems you or members of your family have had with the use of anesthetics.  Any blood disorders you have.  Previous surgeries you have had.  Medical conditions you have. RISKS AND COMPLICATIONS Generally, this is a safe procedure. However, as with any procedure, complications can occur. Possible complications include:   Spinal  or members of your family have had with the use of anesthetics.   Any blood disorders you have.   Previous surgeries you have had.   Medical conditions you have.  RISKS AND COMPLICATIONS  Generally, this is a safe procedure. However, as with any procedure, complications can occur. Possible complications include:    Spinal headache. This is a severe headache that occurs when there is a leak of spinal fluid. A spinal headache causes discomfort but is not dangerous. If it persists, another procedure may be done to treat the headache.   Bleeding. This most often occurs in people with bleeding disorders. These are disorders in which the blood does not clot normally.    Infection at the insertion site that can spread to the bone or spinal  fluid.   Formation of a spinal cord tumor (rare).   Brain herniation or movement of the brain into the spinal cord (rare).   Inability to move (extremely rare).  BEFORE THE PROCEDURE   You may have blood tests done. These tests can help tell how well your kidneys and liver are working. They can also show how well your blood clots.    If you take blood thinners (anticoagulant medicine), ask your health care provider if and when you should stop taking them.    Your health care provider may order a CT scan of your brain.   Make arrangements for someone to drive you home after the procedure.   PROCEDURE   You will be positioned so that the spaces between the bones of the spine (vertebrae) are as wide as possible. This will make it easier to pass the needle into the spinal canal.   Depending on your age and size, you may lie on your side, curled up with your knees under your chin. Or, you may sit with your head resting on a pillow that is placed at waist level.   The skin covering the lower back (or lumbar region) will be cleaned.    The skin may be numbed with medicine.   You may be given pain medicine or a medicine to help you relax (sedative).   A small needle will be inserted in the skin until it enters the space that contains the spinal fluid. The needle will not enter the spinal cord.    The spinal fluid will be collected into tubes.    The needle will be withdrawn, and a bandage will be placed on the site.   AFTER THE PROCEDURE   You will remain lying down for 1 hour or for as long as your health care provider suggests.    The spinal fluid will be sent to a laboratory to be examined. The results of the examination may be available before you go home.   A test, called a culture, may be taken of the spinal fluid if your health care provider thinks you have an infection. If cultures were taken for exam, the results will usually be available in a couple of days.    Document Released:  01/02/2000 Document Revised: 10/25/2012 Document Reviewed: 09/11/2012  Exit[employer] Patient Information 2015 [employer]. This information is not intended to replace advice given to you by your health care provider. Make sure you discuss any questions you have with your health care provider.

## 2013-09-08 NOTE — ED Provider Notes (Signed)
Care transferred to me by Dr. Jodi MourningZavitz. CSF analysis is unremarkable. 1 white blood cell and each tube but no significant RBCs to suggest subarachnoid hemorrhage. Gram stain is normal. Per prior plan Will discharge home. Will recommend outpatient follow up with a PCP.  Audree CamelScott T Darlina Mccaughey, MD 09/08/13 [phone]

## 2013-09-08 NOTE — ED Provider Notes (Signed)
CSN: [id number]     Arrival date & time 09/08/13  0426 History   First MD Initiated Contact with Patient 09/08/13 0444     Chief Complaint  Patient presents with  . Headache     (Consider location/radiation/quality/duration/timing/severity/associated sxs/prior Treatment) HPI Comments: 33 year old male with smoking history, occasional alcohol, no other significant medical history presents with headache, vomiting, spine pain. Patient had gradual onset headache yesterday that gradually worsened throughout the [place] he developed photophobia, nausea and intermittent vomiting. Patient has had midline neck and spine pain and lower back worse with palpation and movement. Patient denies sick contacts, vaccines are up-to-date the child, no injuries, no history of migraines. No fevers or recent travel.  Patient is a 33 y.o. male presenting with headaches. The history is provided by the patient.  Headache Associated symptoms: back pain, nausea, neck pain, neck stiffness, numbness (left foot) and vomiting   Associated symptoms: no abdominal pain, no congestion and no fever     History reviewed. No pertinent past medical history. History reviewed. No pertinent past surgical history. No family history on file. History  Substance Use Topics  . Smoking status: Current Every [place] Smoker -- 0.25 packs/[place]    Types: Cigarettes  . Smokeless tobacco: Not on file  . Alcohol Use: Yes     Comment: occ    Review of Systems  Constitutional: Positive for chills and appetite change. Negative for fever.  HENT: Negative for congestion.   Eyes: Negative for visual disturbance.  Respiratory: Negative for shortness of breath.   Cardiovascular: Negative for chest pain.  Gastrointestinal: Positive for nausea and vomiting. Negative for abdominal pain.  Genitourinary: Negative for dysuria and flank pain.  Musculoskeletal: Positive for back pain, neck pain and neck stiffness.  Skin: Negative for rash.  Neurological:  Positive for light-headedness, numbness (left foot) and headaches. Negative for weakness.      Allergies  Review of patient's allergies indicates no known allergies.  Home Medications   Prior to Admission medications   Not on File   BP 124/80  Pulse 88  Temp(Src) 98.2 F (36.8 C) (Oral)  Resp 16  Ht 6\' 1"  (1.854 m)  Wt 175 lb (79.379 kg)  BMI 23.09 kg/m2  SpO2 100% Physical Exam  Nursing note and vitals reviewed. Constitutional: He is oriented to person, place, and time. He appears well-developed and well-nourished.  HENT:  Head: Normocephalic and atraumatic.  Mild dry mucous membranes  Eyes: Conjunctivae are normal. Right eye exhibits no discharge. Left eye exhibits no discharge.  Neck: Normal range of motion. Neck supple. No tracheal deviation present.  Cardiovascular: Normal rate and regular rhythm.   Pulmonary/Chest: Effort normal and breath sounds normal.  Abdominal: Soft. He exhibits no distension. There is no tenderness. There is no guarding.  Musculoskeletal: He exhibits tenderness. He exhibits no edema.  Paraspinal and midline tenderness cervical lumbar and thoracic.  Neurological: He is alert and oriented to person, place, and time. GCS eye subscore is 4. GCS verbal subscore is 5. GCS motor subscore is 6.  5+ strength in UE and LE with f/e at major joints. Sensation to palpation intact in UE and LE. Patient has subjectively decreased sensation left foot and ankle versus right. CNs 2-12 grossly intact.  EOMFI.  PERRL.   Finger nose and coordination intact bilateral.   Visual fields intact to finger testing. Tender paraspinal cervical, pain with flexion and horizontal rotation. Neck supple this time. Photophobia unable to check for papilledema this time.  Skin: Skin  is warm. No rash noted.  Psychiatric: He has a normal mood and affect.    ED Course  LUMBAR PUNCTURE Date/Time: 09/08/2013 7:04 AM Performed by: Enid Skeens Authorized by: Enid Skeens Consent: Verbal consent obtained. written consent obtained. Consent given by: patient Patient understanding: patient states understanding of the procedure being performed Patient consent: the patient's understanding of the procedure matches consent given Procedure consent: procedure consent matches procedure scheduled Patient identity confirmed: verbally with patient and arm band Time out: Immediately prior to procedure a "time out" was called to verify the correct patient, procedure, equipment, support staff and site/side marked as required. Indications: evaluation for infection Local anesthetic: lidocaine 2% without epinephrine Patient sedated: no Lumbar space: L4-L5 interspace Patient's position: sitting Needle gauge: 20 Needle length: 2.5 in Number of attempts: 1 Tubes of fluid: 4 Total volume: 7 ml Post-procedure: site cleaned, pressure dressing applied and adhesive bandage applied Patient tolerance: Patient tolerated the procedure well with no immediate complications.   (including critical care time) Labs Review Labs Reviewed  BASIC METABOLIC PANEL - Abnormal; Notable for the following:    Glucose, Bld 111 (*)    BUN 26 (*)    GFR calc non Af Amer 72 (*)    GFR calc Af Amer 83 (*)    All other components within normal limits  CBC WITH DIFFERENTIAL - Abnormal; Notable for the following:    WBC 14.2 (*)    Neutro Abs 8.0 (*)    Lymphs Abs 4.7 (*)    Monocytes Absolute 1.2 (*)    All other components within normal limits  CSF CULTURE  GRAM STAIN  SEDIMENTATION RATE  CSF CELL COUNT WITH DIFFERENTIAL  CSF CELL COUNT WITH DIFFERENTIAL  GLUCOSE, CSF  PROTEIN, CSF  HERPES SIMPLEX VIRUS(HSV) DNA BY PCR    Imaging Review Ct Head Wo Contrast  09/08/2013   CLINICAL DATA:  Headache.  EXAM: CT HEAD WITHOUT CONTRAST  TECHNIQUE: Contiguous axial images were obtained from the base of the skull through the vertex without intravenous contrast.  COMPARISON:  12/21/2008  FINDINGS:  Skull and Sinuses:Circumferential mucosal thickening in the bilateral maxillary sinuses where there are retained secretions layering posteriorly.  Orbits: No acute abnormality.  Brain: No evidence of acute abnormality, such as acute infarction, hemorrhage, hydrocephalus, or mass lesion/mass effect.  IMPRESSION: 1. No acute intracranial abnormality. 2. Sinusitis with acute and chronic features.   Electronically Signed   By: Tiburcio Pea M.D.   On: 09/08/2013 06:02     EKG Interpretation None      MDM   Final diagnoses:  Acute frontal sinusitis, recurrence not specified  Acute nonintractable headache, unspecified headache type  Neck pain   Patient with worsening headache, neck pain, nausea and spine pain; discussed differential diagnosis including viral syndrome, migraine, intracranial, meningitis.  Patient healthy otherwise and no history of immune problems. Plan for blood work, pain meds, IV fluids, antiemetics, CT head and reevaluation.  Patient's had neck pain improved with pain medicines however persistent spine pain. Discussed results and benefits of lumbar puncture, patient wishes to proceed. Lumbar puncture performed without difficulty one attempt, CSF sent to lab. Repeat pain meds, and his pain improved.  Paged neurology on call to discuss plan and will sign patient out to emergency provider to followup lumbar puncture results, reassess and final disposition.  Paged and spoke with neurology Dr. Roseanne Reno, discussed concern for spine pain with headache and neck pain and whether MRI would be indicated to look  for signs of discitis. With normal sedimentation rate, no fever and patient improving he recommended NSAIDs and close followup outpatient. No MRI indicated at this time and no transfer needed and last CSF show significant abnormalities.   Filed Vitals:   09/08/13 0433 09/08/13 0653  BP: 124/80 107/72  Pulse: 88 66  Temp: 98.2 F (36.8 C)   TempSrc: Oral   Resp: 16 20   Height: 6\' 1"  (1.854 m)   Weight: 175 lb (79.379 kg)   SpO2: 100% 100%      Enid Skeens, MD 09/08/13 [phone]

## 2013-09-08 NOTE — ED Notes (Signed)
Pt reports severe HA with neck pain and pain radiating down spine to sacral area since yesterday.  Vomiting and not able to keep anything down. Photosensitivity.

## 2013-09-10 LAB — HERPES SIMPLEX VIRUS(HSV) DNA BY PCR
HSV 1 DNA: NOT DETECTED
HSV 2 DNA: NOT DETECTED

## 2013-09-11 LAB — CSF CULTURE

## 2013-09-11 LAB — CSF CULTURE W GRAM STAIN: Culture: NO GROWTH

## 2014-02-11 ENCOUNTER — Emergency Department (HOSPITAL_COMMUNITY)
Admission: EM | Admit: 2014-02-11 | Discharge: 2014-02-11 | Disposition: A | Discharge status: Home / Self Care | Payer: BLUE CROSS/BLUE SHIELD | Attending: Emergency Medicine | Admitting: Emergency Medicine

## 2014-02-11 ENCOUNTER — Encounter (HOSPITAL_COMMUNITY): Payer: Self-pay

## 2014-02-11 ENCOUNTER — Emergency Department (HOSPITAL_COMMUNITY): Payer: BLUE CROSS/BLUE SHIELD

## 2014-02-11 DIAGNOSIS — R1013 Epigastric pain: Secondary | ICD-10-CM

## 2014-02-11 DIAGNOSIS — Z720 Tobacco use: Secondary | ICD-10-CM | POA: Diagnosis not present

## 2014-02-11 DIAGNOSIS — R112 Nausea with vomiting, unspecified: Secondary | ICD-10-CM | POA: Diagnosis not present

## 2014-02-11 DIAGNOSIS — R109 Unspecified abdominal pain: Secondary | ICD-10-CM

## 2014-02-11 DIAGNOSIS — R197 Diarrhea, unspecified: Secondary | ICD-10-CM | POA: Diagnosis not present

## 2014-02-11 DIAGNOSIS — R1011 Right upper quadrant pain: Secondary | ICD-10-CM

## 2014-02-11 DIAGNOSIS — R1110 Vomiting, unspecified: Secondary | ICD-10-CM

## 2014-02-11 LAB — CBC WITH DIFFERENTIAL/PLATELET
BASOS ABS: 0 10*3/uL (ref 0.0–0.1)
Basophils Relative: 0 % (ref 0–1)
EOS PCT: 1 % (ref 0–5)
Eosinophils Absolute: 0.1 10*3/uL (ref 0.0–0.7)
HCT: 44.4 % (ref 39.0–52.0)
HEMOGLOBIN: 15.5 g/dL (ref 13.0–17.0)
Lymphocytes Relative: 28 % (ref 12–46)
Lymphs Abs: 3.2 10*3/uL (ref 0.7–4.0)
MCH: 32.9 pg (ref 26.0–34.0)
MCHC: 34.9 g/dL (ref 30.0–36.0)
MCV: 94.3 fL (ref 78.0–100.0)
Monocytes Absolute: 1 10*3/uL (ref 0.1–1.0)
Monocytes Relative: 9 % (ref 3–12)
NEUTROS ABS: 7.2 10*3/uL (ref 1.7–7.7)
Neutrophils Relative %: 62 % (ref 43–77)
PLATELETS: 229 10*3/uL (ref 150–400)
RBC: 4.71 MIL/uL (ref 4.22–5.81)
RDW: 12.4 % (ref 11.5–15.5)
WBC: 11.5 10*3/uL — ABNORMAL HIGH (ref 4.0–10.5)

## 2014-02-11 LAB — COMPREHENSIVE METABOLIC PANEL
ALK PHOS: 98 U/L (ref 39–117)
ALT: 17 U/L (ref 0–53)
AST: 24 U/L (ref 0–37)
Albumin: 4.4 g/dL (ref 3.5–5.2)
Anion gap: 10 (ref 5–15)
BUN: 9 mg/dL (ref 6–23)
CO2: 22 mmol/L (ref 19–32)
CREATININE: 0.85 mg/dL (ref 0.50–1.35)
Calcium: 9.4 mg/dL (ref 8.4–10.5)
Chloride: 106 mmol/L (ref 96–112)
GFR calc Af Amer: >90 mL/min (ref >90)
GFR calc non Af Amer: >90 mL/min (ref >90)
Glucose, Bld: 107 mg/dL — ABNORMAL HIGH (ref 70–99)
Potassium: 4.6 mmol/L (ref 3.5–5.1)
Sodium: 138 mmol/L (ref 135–145)
Total Bilirubin: 1.3 mg/dL — ABNORMAL HIGH (ref 0.3–1.2)
Total Protein: 7.3 g/dL (ref 6.0–8.3)

## 2014-02-11 LAB — URINALYSIS, ROUTINE W REFLEX MICROSCOPIC
Glucose, UA: NEGATIVE mg/dL
Ketones, ur: 15 mg/dL — AB
LEUKOCYTES UA: NEGATIVE
NITRITE: NEGATIVE
PROTEIN: NEGATIVE mg/dL
Specific Gravity, Urine: 1.024 (ref 1.005–1.030)
UROBILINOGEN UA: 1 mg/dL (ref 0.0–1.0)
pH: 6 (ref 5.0–8.0)

## 2014-02-11 LAB — URINE MICROSCOPIC-ADD ON

## 2014-02-11 LAB — LIPASE, BLOOD: Lipase: 27 U/L (ref 11–59)

## 2014-02-11 MED ORDER — SODIUM CHLORIDE 0.9 % IV BOLUS (SEPSIS)
1000.0000 mL | Freq: Once | INTRAVENOUS | Status: AC
  Administered 2014-02-11: 1000 mL via INTRAVENOUS

## 2014-02-11 MED ORDER — IOHEXOL 300 MG/ML  SOLN
80.0000 mL | Freq: Once | INTRAMUSCULAR | Status: AC | PRN
  Administered 2014-02-11: 1 mL via INTRAVENOUS

## 2014-02-11 MED ORDER — HYDROMORPHONE HCL 1 MG/ML IJ SOLN
1.0000 mg | Freq: Once | INTRAMUSCULAR | Status: AC
  Administered 2014-02-11: 1 mg via INTRAVENOUS
  Filled 2014-02-11: qty 1

## 2014-02-11 MED ORDER — ONDANSETRON 8 MG PO TBDP: ORAL_TABLET | ORAL | Status: AC

## 2014-02-11 MED ORDER — IOHEXOL 300 MG/ML  SOLN
25.0000 mL | Freq: Once | INTRAMUSCULAR | Status: AC | PRN
  Administered 2014-02-11: 25 mL via ORAL

## 2014-02-11 MED ORDER — ONDANSETRON HCL 4 MG/2ML IJ SOLN
4.0000 mg | Freq: Once | INTRAMUSCULAR | Status: AC
  Administered 2014-02-11: 4 mg via INTRAVENOUS
  Filled 2014-02-11: qty 2

## 2014-02-11 MED ORDER — PANTOPRAZOLE SODIUM 40 MG IV SOLR
40.0000 mg | Freq: Once | INTRAVENOUS | Status: AC
  Administered 2014-02-11: 40 mg via INTRAVENOUS
  Filled 2014-02-11: qty 40

## 2014-02-11 MED ORDER — OXYCODONE-ACETAMINOPHEN 5-325 MG PO TABS: 1.0000 | ORAL_TABLET | ORAL | Status: AC | PRN

## 2014-02-11 NOTE — ED Provider Notes (Signed)
CSN: [id number]     Arrival date & time 02/11/14  0840 History   First MD Initiated Contact with Patient 02/11/14 [phone]     Chief Complaint  Patient presents with  . Abdominal Pain     Patient is a 34 y.o. male presenting with abdominal pain. The history is provided by the patient.  Abdominal Pain Pain location:  Epigastric Pain quality: sharp   Pain radiates to:  L shoulder Pain severity:  Severe Onset quality:  Gradual Duration:  48 hours Timing:  Constant Progression:  Worsening Chronicity:  New Relieved by:  Nothing Worsened by:  Movement and palpation Associated symptoms: diarrhea, nausea and vomiting   Associated symptoms: no chest pain, no fever and no melena   Diarrhea:    Quality:  Watery Risk factors: has not had multiple surgeries and no recent hospitalization   patient reports epigastric abd pain for 48 hours with associated nonbloody vomiting/diarrhea No CP He has never had this type of pain previously  PMH - none Surg history - negative Soc hx - occasional ETOH use No travel recently No sick contacts History  Substance Use Topics  . Smoking status: Current Every [place] Smoker -- 0.25 packs/[place]    Types: Cigarettes  . Smokeless tobacco: Not on file  . Alcohol Use: Yes     Comment: occ    Review of Systems  Constitutional: Negative for fever.  Cardiovascular: Negative for chest pain.  Gastrointestinal: Positive for nausea, vomiting, abdominal pain and diarrhea. Negative for blood in stool and melena.  All other systems reviewed and are negative.     Allergies  Review of patient's allergies indicates no known allergies.  Home Medications   Prior to Admission medications   Medication Sig Start Date End Date Taking? Authorizing Provider  HYDROcodone-acetaminophen (NORCO) 5-325 MG per tablet Take 1 tablet by mouth every 4 (four) hours as needed. Patient not taking: Reported on 02/11/2014 09/08/13   Audree Camel, MD  ibuprofen (ADVIL,MOTRIN) 800 MG  tablet Take 1 tablet (800 mg total) by mouth 3 (three) times daily. Patient not taking: Reported on 02/11/2014 09/08/13   Audree Camel, MD   BP 142/72 mmHg  Pulse 106  Temp(Src) 98 F (36.7 C) (Oral)  Resp 20  Ht  (1.854 m)  Wt 180 lb (81.647 kg)  BMI 23.75 kg/m2  SpO2 99% Physical Exam CONSTITUTIONAL: Well developed/well nourished HEAD: Normocephalic/atraumatic EYES: EOMI/PERRL, no icterus ENMT: Mucous membranes moist NECK: supple no meningeal signs SPINE/BACK:entire spine nontender CV: S1/S2 noted, no murmurs/rubs/gallops noted LUNGS: Lungs are clear to auscultation bilaterally, no apparent distress ABDOMEN: soft, moderate tenderness to RUQ/epigastric region, no rebound or guarding, bowel sounds noted throughout abdomen GU:no cva tenderness NEURO: Pt is awake/alert/appropriate, moves all extremitiesx4.  No facial droop.   EXTREMITIES: pulses normal/equal, full ROM SKIN: warm, color normal PSYCH: no abnormalities of mood noted, alert and oriented to situation  ED Course  Procedures  10:20 AM Pt continues with abd pain with focal RUQ/epigastric tenderness Will proceed with US imaging 1:10 PM Pt reports abd pain is returning Will obtain CT imaging 3:42 PM CT negative No vomiting here but has had some diarrhea He is in no distress All imaging negative He is appropriate for d/c home BP 123/73 mmHg  Pulse 67  Temp(Src) 98 F (36.7 C) (Oral)  Resp 17  Ht  (1.854 m)  Wt 180 lb (81.647 kg)  BMI 23.75 kg/m2  SpO2 99%   Labs Review Labs Reviewed  CBC WITH DIFFERENTIAL/PLATELET - Abnormal; Notable for the following:    WBC 11.5 (*)    All other components within normal limits  COMPREHENSIVE METABOLIC PANEL - Abnormal; Notable for the following:    Glucose, Bld 107 (*)    Total Bilirubin 1.3 (*)    All other components within normal limits  URINALYSIS, ROUTINE W REFLEX MICROSCOPIC - Abnormal; Notable for the following:    Color, Urine AMBER (*)    Hgb  urine dipstick SMALL (*)    Bilirubin Urine SMALL (*)    Ketones, ur 15 (*)    All other components within normal limits  URINE MICROSCOPIC-ADD ON - Abnormal; Notable for the following:    Bacteria, UA FEW (*)    All other components within normal limits  LIPASE, BLOOD    Imaging Review US Abdomen Complete  02/11/2014   CLINICAL DATA:  Right upper quadrant pain for 2 days  EXAM: ULTRASOUND ABDOMEN COMPLETE  COMPARISON:  None.  FINDINGS: Gallbladder: No gallstones or wall thickening visualized. No sonographic Murphy sign noted.  Common bile duct: Diameter: 3.8 mm.  Liver: No focal lesion identified. Within normal limits in parenchymal echogenicity.  IVC: No abnormality visualized.  Pancreas: Visualized portion unremarkable.  Spleen: Size and appearance within normal limits.  Right Kidney: Length: 11.5 cm. Echogenicity within normal limits. No mass or hydronephrosis visualized.  Left Kidney: Length: 11.1 cm. Echogenicity within normal limits. No mass or hydronephrosis visualized.  Abdominal aorta: No aneurysm visualized.  Other findings: None.  IMPRESSION: No acute abnormality noted.   Electronically Signed   By: Alcide Clever M.D.   On: 02/11/2014 11:13   Ct Abdomen Pelvis W Contrast  02/11/2014   CLINICAL DATA:  Epigastric pain radiating to the back for 2 days with fever and diarrhea. Bilious vomiting.  EXAM: CT ABDOMEN AND PELVIS WITH CONTRAST  TECHNIQUE: Multidetector CT imaging of the abdomen and pelvis was performed using the standard protocol following bolus administration of intravenous contrast.  CONTRAST:  100 ml OMNIPAQUE IOHEXOL 300 MG/ML  SOLN  COMPARISON:  None.  FINDINGS: The lung bases are clear.  No pleural or pericardial effusion.  The gallbladder, liver, spleen, adrenal glands, kidneys, pancreas and biliary tree all appear normal. The stomach, small and large bowel and appendix appear normal. There is no lymphadenopathy or fluid. No bony abnormality is identified.  IMPRESSION: Negative  CT abdomen and pelvis.   Electronically Signed   By: Drusilla Kanner M.D.   On: 02/11/2014 15:06     EKG Interpretation   Date/Time:  Monday February 11 2014 09:15:21 EST Ventricular Rate:  79 PR Interval:  119 QRS Duration: 79 QT Interval:  350 QTC Calculation: 401 R Axis:   74 Text Interpretation:  Sinus rhythm Borderline short PR interval T wave  inversion No previous ECGs available Confirmed by Bebe Shaggy  MD, Laurice Kimmons  [phone]) on 02/11/2014 10:00:18 AM     Medications  ondansetron (ZOFRAN) injection 4 mg (4 mg Intravenous Given 02/11/14 0909)  sodium chloride 0.9 % bolus 1,000 mL (0 mLs Intravenous Stopped 02/11/14 1042)  HYDROmorphone (DILAUDID) injection 1 mg (1 mg Intravenous Given 02/11/14 0909)  HYDROmorphone (DILAUDID) injection 1 mg (1 mg Intravenous Given 02/11/14 1043)  pantoprazole (PROTONIX) injection 40 mg (40 mg Intravenous Given 02/11/14 1150)  HYDROmorphone (DILAUDID) injection 1 mg (1 mg Intravenous Given 02/11/14 1336)  ondansetron (ZOFRAN) injection 4 mg (4 mg Intravenous Given 02/11/14 1336)  iohexol (OMNIPAQUE) 300 MG/ML solution 25 mL (25 mLs Oral Contrast Given  02/11/14 1325)  iohexol (OMNIPAQUE) 300 MG/ML solution 80 mL (1 mL Intravenous Contrast Given 02/11/14 1443)    MDM   Final diagnoses:  RUQ pain  Abdominal pain  Vomiting and diarrhea    Nursing notes including past medical history and social history reviewed and considered in documentation Labs/vital reviewed myself and considered during evaluation     Joya Gaskinsonald W Jocelyne Reinertsen, MD 02/11/14 [phone]

## 2014-02-11 NOTE — Discharge Instructions (Signed)

## 2014-02-11 NOTE — ED Notes (Signed)
Pt reports upper abdominal pain with N/V/D x 48 hours. Describes stool as "cement colored." Pt reports taking OTC medications with no relief.

## 2014-06-13 ENCOUNTER — Emergency Department (HOSPITAL_BASED_OUTPATIENT_CLINIC_OR_DEPARTMENT_OTHER)
Admission: EM | Admit: 2014-06-13 | Discharge: 2014-06-13 | Disposition: A | Discharge status: Home / Self Care | Payer: Worker's Compensation | Attending: Emergency Medicine | Admitting: Emergency Medicine

## 2014-06-13 ENCOUNTER — Encounter (HOSPITAL_BASED_OUTPATIENT_CLINIC_OR_DEPARTMENT_OTHER): Payer: Self-pay | Admitting: *Deleted

## 2014-06-13 DIAGNOSIS — F419 Anxiety disorder, unspecified: Secondary | ICD-10-CM | POA: Insufficient documentation

## 2014-06-13 DIAGNOSIS — S71152A Open bite, left thigh, initial encounter: Secondary | ICD-10-CM | POA: Diagnosis present

## 2014-06-13 DIAGNOSIS — R000 Tachycardia, unspecified: Secondary | ICD-10-CM | POA: Insufficient documentation

## 2014-06-13 DIAGNOSIS — Z720 Tobacco use: Secondary | ICD-10-CM | POA: Diagnosis not present

## 2014-06-13 DIAGNOSIS — Y9389 Activity, other specified: Secondary | ICD-10-CM | POA: Diagnosis not present

## 2014-06-13 DIAGNOSIS — Y9289 Other specified places as the place of occurrence of the external cause: Secondary | ICD-10-CM | POA: Diagnosis not present

## 2014-06-13 DIAGNOSIS — Y998 Other external cause status: Secondary | ICD-10-CM | POA: Insufficient documentation

## 2014-06-13 DIAGNOSIS — Z23 Encounter for immunization: Secondary | ICD-10-CM | POA: Insufficient documentation

## 2014-06-13 DIAGNOSIS — W540XXA Bitten by dog, initial encounter: Secondary | ICD-10-CM | POA: Diagnosis not present

## 2014-06-13 MED ORDER — AMOXICILLIN-POT CLAVULANATE 875-125 MG PO TABS: 1.0000 | ORAL_TABLET | Freq: Two times a day (BID) | ORAL | Status: AC

## 2014-06-13 MED ORDER — TETANUS-DIPHTH-ACELL PERTUSSIS 5-2.5-18.5 LF-MCG/0.5 IM SUSP
0.5000 mL | Freq: Once | INTRAMUSCULAR | Status: AC
  Administered 2014-06-13: 0.5 mL via INTRAMUSCULAR
  Filled 2014-06-13: qty 0.5

## 2014-06-13 MED ORDER — HYDROCODONE-ACETAMINOPHEN 5-325 MG PO TABS
1.0000 | ORAL_TABLET | Freq: Once | ORAL | Status: AC
  Administered 2014-06-13: 1 via ORAL
  Filled 2014-06-13: qty 1

## 2014-06-13 MED ORDER — AMOXICILLIN-POT CLAVULANATE 875-125 MG PO TABS
1.0000 | ORAL_TABLET | Freq: Once | ORAL | Status: AC
  Administered 2014-06-13: 1 via ORAL
  Filled 2014-06-13: qty 1

## 2014-06-13 NOTE — ED Provider Notes (Signed)
CSN: [id number]     Arrival date & time 06/13/14  4098 History  This chart was scribed for Maurice Octave, MD by Leone Payor, ED Scribe. This patient was seen in room MH11/MH11 and the patient's care was started 9:52 AM.    Chief Complaint  Patient presents with  . Animal Bite   HPI   HPI Comments: Maurice Stewart is a 34 y.o. male who presents to the Emergency Department complaining of a dog bite to the left thigh that occurred 30 min ago. Patient states he was working and attempted to pet a dog when it bit him, causing his pant leg to rip. He has a laceration to the left anterior thigh with bleeding controlled by applying pressure. He denies any other injuries or pain.   He also complains a minimally painful and enlarged area between the rectum and scrotum for the past 3 days. He denies bleeding or draining. He denies fever, vomiting.   History reviewed. No pertinent past medical history. History reviewed. No pertinent past surgical history. No family history on file. History  Substance Use Topics  . Smoking status: Current Every [place] Smoker -- 0.25 packs/[place]    Types: Cigarettes  . Smokeless tobacco: Never Used  . Alcohol Use: Yes     Comment: 6 beers/week    Review of Systems  A complete 10 system review of systems was obtained and all systems are negative except as noted in the HPI and PMH.    Allergies  Review of patient's allergies indicates no known allergies.  Home Medications   Prior to Admission medications   Medication Sig Start Date End Date Taking? Authorizing Provider  amoxicillin-clavulanate (AUGMENTIN) 875-125 MG per tablet Take 1 tablet by mouth every 12 (twelve) hours. 06/13/14   Maurice Octave, MD  ondansetron (ZOFRAN ODT) 8 MG disintegrating tablet  ODT q4 hours prn nausea 02/11/14   Zadie Rhine, MD  oxyCODONE-acetaminophen (PERCOCET/ROXICET) 5-325 MG per tablet Take 1 tablet by mouth every 4 (four) hours as needed for severe pain. 02/11/14   Zadie Rhine, MD   BP 116/63 mmHg  Pulse 82  Temp(Src) 98.8 F (37.1 C) (Oral)  Resp 18  Ht  (1.854 m)  Wt 175 lb (79.379 kg)  BMI 23.09 kg/m2  SpO2 95% Physical Exam  Constitutional: He is oriented to person, place, and time. He appears well-developed and well-nourished. No distress.  Anxious appearing  HENT:  Head: Normocephalic and atraumatic.  Mouth/Throat: Oropharynx is clear and moist. No oropharyngeal exudate.  Eyes: Conjunctivae and EOM are normal. Pupils are equal, round, and reactive to light.  Neck: Normal range of motion. Neck supple.  No meningismus.  Cardiovascular: Regular rhythm, normal heart sounds and intact distal pulses.  Tachycardia present.   No murmur heard. Intact dp/pt pulse  Pulmonary/Chest: Effort normal and breath sounds normal. No respiratory distress.  Abdominal: Soft. There is no tenderness. There is no rebound and no guarding.  Musculoskeletal: Normal range of motion. He exhibits no edema or tenderness.  Dog bite to left inner thigh. Bleeding controlled.   Neurological: He is alert and oriented to person, place, and time. No cranial nerve deficit. He exhibits normal muscle tone. Coordination normal.  No ataxia on finger to nose bilaterally. No pronator drift. 5/5 strength throughout. CN 2-12 intact. Negative Romberg. Equal grip strength. Sensation intact. Gait is normal.   Skin: Skin is warm.  Psychiatric: He has a normal mood and affect. His behavior is normal.  Nursing note and  vitals reviewed.       ED Course  Procedures   DIAGNOSTIC STUDIES: Oxygen Saturation is 98% on RA, normal by my interpretation.    COORDINATION OF CARE: 9:59 AM Discussed treatment plan with pt at bedside and pt agreed to plan.   Labs Review Labs Reviewed - No data to display  Imaging Review No results found.   EKG Interpretation None      MDM   Final diagnoses:  Dog bite of thigh, left, initial encounter   Dogbite to left thigh by a domesticated  dog. Bleeding controlled. Neurovascularly intact.  Clean wound, up-to-date tetanus  Discussed with patient he is at low risk for rabies. He declines rabies vaccination series. Police have contacted the dog owner and dog is up to date on rabies shots.  Start antibiotics. Discussed local wound care. Follow up with PCP, return precautions discussed.   I personally performed the services described in this documentation, which was scribed in my presence. The recorded information has been reviewed and is accurate.   Maurice OctaveStephen Laden Fieldhouse, MD 06/13/14 1344

## 2014-06-13 NOTE — ED Notes (Signed)
D/c home with ride- directed to pharmacy to pick up medications 

## 2014-06-13 NOTE — ED Notes (Signed)
MD at bedside. 

## 2014-06-13 NOTE — ED Notes (Signed)
Pt bitten by dog left thigh while working- laceration anterior left thigh- bleeding controlled

## 2014-06-13 NOTE — Discharge Instructions (Signed)
Animal Bite Take the antibiotics as prescribed. Keep the wound clean and dry. Follow-up for recheck in 48 hours with your doctor. Return to the ED if you develop new or worsening symptoms. An animal bite can result in a scratch on the skin, deep open cut, puncture of the skin, crush injury, or tearing away of the skin or a body part. Dogs are responsible for most animal bites. Children are bitten more often than adults. An animal bite can range from very mild to more serious. A small bite from your house pet is no cause for alarm. However, some animal bites can become infected or injure a bone or other tissue. You must seek medical care if:  The skin is broken and bleeding does not slow down or stop after 15 minutes.  The puncture is deep and difficult to clean (such as a cat bite).  Pain, warmth, redness, or pus develops around the wound.  The bite is from a stray animal or rodent. There may be a risk of rabies infection.  The bite is from a snake, raccoon, skunk, fox, coyote, or bat. There may be a risk of rabies infection.  The person bitten has a chronic illness such as diabetes, liver disease, or cancer, or the person takes medicine that lowers the immune system.  There is concern about the location and severity of the bite. It is important to clean and protect an animal bite wound right away to prevent infection. Follow these steps:  Clean the wound with plenty of water and soap.  Apply an antibiotic cream.  Apply gentle pressure over the wound with a clean towel or gauze to slow or stop bleeding.  Elevate the affected area above the heart to help stop any bleeding.  Seek medical care. Getting medical care within 8 hours of the animal bite leads to the best possible outcome. DIAGNOSIS  Your caregiver will most likely:  Take a detailed history of the animal and the bite injury.  Perform a wound exam.  Take your medical history. Blood tests or X-rays may be performed.  Sometimes, infected bite wounds are cultured and sent to a lab to identify the infectious bacteria.  TREATMENT  Medical treatment will depend on the location and type of animal bite as well as the patient's medical history. Treatment may include:  Wound care, such as cleaning and flushing the wound with saline solution, bandaging, and elevating the affected area.  Antibiotics.  Tetanus immunization.  Rabies immunization.  Leaving the wound open to heal. This is often done with animal bites, due to the high risk of infection. However, in certain cases, wound closure with stitches, wound adhesive, skin adhesive strips, or staples may be used. Infected bites that are left untreated may require intravenous (IV) antibiotics and surgical treatment in the hospital. HOME CARE INSTRUCTIONS  Follow your caregiver's instructions for wound care.  Take all medicines as directed.  If your caregiver prescribes antibiotics, take them as directed. Finish them even if you start to feel better.  Follow up with your caregiver for further exams or immunizations as directed. You may need a tetanus shot if:  You cannot remember when you had your last tetanus shot.  You have never had a tetanus shot.  The injury broke your skin. If you get a tetanus shot, your arm may swell, get red, and feel warm to the touch. This is common and not a problem. If you need a tetanus shot and you choose not to have  one, there is a rare chance of getting tetanus. Sickness from tetanus can be serious. SEEK MEDICAL CARE IF:  You notice warmth, redness, soreness, swelling, pus discharge, or a bad smell coming from the wound.  You have a red line on the skin coming from the wound.  You have a fever, chills, or a general ill feeling.  You have nausea or vomiting.  You have continued or worsening pain.  You have trouble moving the injured part.  You have other questions or concerns. MAKE SURE YOU:  Understand these  instructions.  Will watch your condition.  Will get help right away if you are not doing well or get worse. Document Released: 09/22/2010 Document Revised: 03/29/2011 Document Reviewed: 09/22/2010 [facility] Patient Information 2015 [place]. This information is not intended to replace advice given to you by your health care provider. Make sure you discuss any questions you have with your health care provider.

## 2014-07-26 ENCOUNTER — Emergency Department (HOSPITAL_COMMUNITY)
Admission: EM | Admit: 2014-07-26 | Discharge: 2014-07-26 | Discharge status: Against Medical Advice | Payer: Self-pay | Attending: Emergency Medicine | Admitting: Emergency Medicine

## 2014-07-26 ENCOUNTER — Encounter (HOSPITAL_COMMUNITY): Payer: Self-pay | Admitting: Emergency Medicine

## 2014-07-26 ENCOUNTER — Emergency Department (HOSPITAL_COMMUNITY): Payer: Self-pay

## 2014-07-26 DIAGNOSIS — Z8781 Personal history of (healed) traumatic fracture: Secondary | ICD-10-CM | POA: Insufficient documentation

## 2014-07-26 DIAGNOSIS — Z792 Long term (current) use of antibiotics: Secondary | ICD-10-CM | POA: Insufficient documentation

## 2014-07-26 DIAGNOSIS — H53149 Visual discomfort, unspecified: Secondary | ICD-10-CM | POA: Insufficient documentation

## 2014-07-26 DIAGNOSIS — R519 Headache, unspecified: Secondary | ICD-10-CM

## 2014-07-26 DIAGNOSIS — Z8659 Personal history of other mental and behavioral disorders: Secondary | ICD-10-CM | POA: Insufficient documentation

## 2014-07-26 DIAGNOSIS — M542 Cervicalgia: Secondary | ICD-10-CM | POA: Insufficient documentation

## 2014-07-26 DIAGNOSIS — R51 Headache: Secondary | ICD-10-CM | POA: Insufficient documentation

## 2014-07-26 DIAGNOSIS — Z720 Tobacco use: Secondary | ICD-10-CM | POA: Insufficient documentation

## 2014-07-26 DIAGNOSIS — R112 Nausea with vomiting, unspecified: Secondary | ICD-10-CM | POA: Insufficient documentation

## 2014-07-26 HISTORY — DX: Sciatica, unspecified side: M54.30

## 2014-07-26 HISTORY — DX: Post-traumatic stress disorder, unspecified: F43.10

## 2014-07-26 HISTORY — DX: Other cervical disc displacement, unspecified cervical region: M50.20

## 2014-07-26 HISTORY — DX: Fracture of unspecified parts of lumbosacral spine and pelvis, initial encounter for closed fracture: S32.9XXA

## 2014-07-26 LAB — CBC WITH DIFFERENTIAL/PLATELET
BASOS PCT: 0 % (ref 0–1)
Basophils Absolute: 0 10*3/uL (ref 0.0–0.1)
EOS ABS: 0.1 10*3/uL (ref 0.0–0.7)
EOS PCT: 1 % (ref 0–5)
HEMATOCRIT: 43.9 % (ref 39.0–52.0)
HEMOGLOBIN: 15.2 g/dL (ref 13.0–17.0)
Lymphocytes Relative: 32 % (ref 12–46)
Lymphs Abs: 3.3 10*3/uL (ref 0.7–4.0)
MCH: 33.5 pg (ref 26.0–34.0)
MCHC: 34.6 g/dL (ref 30.0–36.0)
MCV: 96.7 fL (ref 78.0–100.0)
MONO ABS: 0.6 10*3/uL (ref 0.1–1.0)
MONOS PCT: 6 % (ref 3–12)
Neutro Abs: 6.3 10*3/uL (ref 1.7–7.7)
Neutrophils Relative %: 61 % (ref 43–77)
Platelets: 255 10*3/uL (ref 150–400)
RBC: 4.54 MIL/uL (ref 4.22–5.81)
RDW: 13.4 % (ref 11.5–15.5)
WBC: 10.4 10*3/uL (ref 4.0–10.5)

## 2014-07-26 LAB — BASIC METABOLIC PANEL
Anion gap: 13 (ref 5–15)
BUN: 14 mg/dL (ref 6–20)
CALCIUM: 9.3 mg/dL (ref 8.9–10.3)
CO2: 22 mmol/L (ref 22–32)
Chloride: 101 mmol/L (ref 101–111)
Creatinine, Ser: 0.84 mg/dL (ref 0.61–1.24)
GFR calc Af Amer: >60 mL/min (ref >60)
Glucose, Bld: 97 mg/dL (ref 65–99)
POTASSIUM: 4.2 mmol/L (ref 3.5–5.1)
Sodium: 136 mmol/L (ref 135–145)

## 2014-07-26 MED ORDER — METOCLOPRAMIDE HCL 5 MG/ML IJ SOLN
10.0000 mg | Freq: Once | INTRAMUSCULAR | Status: AC
  Administered 2014-07-26: 10 mg via INTRAVENOUS
  Filled 2014-07-26: qty 2

## 2014-07-26 MED ORDER — SODIUM CHLORIDE 0.9 % IV BOLUS (SEPSIS)
1000.0000 mL | Freq: Once | INTRAVENOUS | Status: AC
  Administered 2014-07-26: 1000 mL via INTRAVENOUS

## 2014-07-26 MED ORDER — DIPHENHYDRAMINE HCL 50 MG/ML IJ SOLN
25.0000 mg | Freq: Once | INTRAMUSCULAR | Status: AC
  Administered 2014-07-26: 25 mg via INTRAVENOUS
  Filled 2014-07-26: qty 1

## 2014-07-26 MED ORDER — IOHEXOL 350 MG/ML SOLN: 50.0000 mL | Freq: Once | INTRAVENOUS | Status: DC | PRN

## 2014-07-26 NOTE — ED Notes (Signed)
Pt has not been located. PA is aware. At this point, it is decided pt has left AMA

## 2014-07-26 NOTE — ED Notes (Signed)
Pt came out of room fully dressed after PA notified pt that he would need CT. Still has IV in arm stating he has to urinate. He unhooked the NS fluids himself and RN proceeded to show pt to RR 7 to use the restroom.

## 2014-07-26 NOTE — ED Provider Notes (Signed)
CSN: [id number]     Arrival date & time 07/26/14  1111 History   First MD Initiated Contact with Patient 07/26/14 1113     Chief Complaint  Patient presents with  . Headache   Caetano Oberhaus Hinze is a 34 y.o. male who is otherwise healthy who presents to the ED complaining of a posterior headache and neck pain for the past two days. Patient currently complains of in 6 out of 10 posterior headache with associated neck pain but has gradually worsened over the past 2 days. He reports associated photophobia with nausea and vomiting. She denies any current nausea reports he vomited twice earlier today. He denies history of migraines or previous similar headache. He denies double vision or changes to his vision. He denies any head or neck injury. As any tick bites or insects on his body. He denies any rashes on his body. The patient denies fevers, chills, ear pain, eye pain, rashes, numbness, tingling, weakness, lightheadedness, dizziness, head or neck injury, abdominal pain, hematemesis, or hematochezia. He denies manipulation to his neck or visiting a chiropractor.   (Consider location/radiation/quality/duration/timing/severity/associated sxs/prior Treatment) HPI  Past Medical History  Diagnosis Date  . PTSD (post-traumatic stress disorder)   . Slipped cervical disc   . Sciatic leg pain   . Pelvic fracture    History reviewed. No pertinent past surgical history. History reviewed. No pertinent family history. History  Substance Use Topics  . Smoking status: Current Every Eoff Smoker -- 0.25 packs/Gram    Types: Cigarettes  . Smokeless tobacco: Never Used  . Alcohol Use: Yes     Comment: 6 beers/week    Review of Systems  Constitutional: Negative for fever, chills and fatigue.  HENT: Negative for congestion, ear pain, facial swelling, hearing loss, mouth sores, rhinorrhea, sore throat and trouble swallowing.   Eyes: Positive for photophobia. Negative for pain, itching and visual disturbance.   Respiratory: Negative for cough, shortness of breath and wheezing.   Cardiovascular: Negative for chest pain.  Gastrointestinal: Positive for nausea and vomiting. Negative for abdominal pain and blood in stool.  Genitourinary: Negative for dysuria and hematuria.  Musculoskeletal: Positive for neck pain and neck stiffness. Negative for myalgias and back pain.  Skin: Negative for rash and wound.  Neurological: Negative for syncope, weakness, light-headedness, numbness and headaches.      Allergies  Review of patient's allergies indicates no known allergies.  Home Medications   Prior to Admission medications   Medication Sig Start Date End Date Taking? Authorizing Provider  amoxicillin-clavulanate (AUGMENTIN) 875-125 MG per tablet Take 1 tablet by mouth every 12 (twelve) hours. 06/13/14   [name], MD  ondansetron (ZOFRAN ODT) 8 MG disintegrating tablet  ODT q4 hours prn nausea 02/11/14   Zadie Rhine, MD  oxyCODONE-acetaminophen (PERCOCET/ROXICET) 5-325 MG per tablet Take 1 tablet by mouth every 4 (four) hours as needed for severe pain. 02/11/14   Zadie Rhine, MD   BP 108/86 mmHg  Pulse 79  Temp(Src) 98.1 F (36.7 C) (Oral)  Resp 15  Ht  (1.803 m)  Wt 180 lb (81.647 kg)  BMI 25.12 kg/m2  SpO2 97% Physical Exam  Constitutional: He is oriented to person, place, and time. He appears well-developed and well-nourished. No distress.  Nontoxic appearing.  HENT:  Head: Normocephalic and atraumatic.  Right Ear: External ear normal.  Left Ear: External ear normal.  Mouth/Throat: Oropharynx is clear and moist. No oropharyngeal exudate.  Bilateral tympanic membranes are pearly-gray without erythema or loss of  landmarks.   Eyes: Conjunctivae and EOM are normal. Pupils are equal, round, and reactive to light. Right eye exhibits no discharge. Left eye exhibits no discharge.  Neck: Neck supple. No JVD present. No tracheal deviation present. Kernig's sign noted. No  Brudzinski's sign noted.  Positive Kernig's sign. Negative Bruzinkski's sign. Midline neck tenderness without edema, erythema or deformity.   Cardiovascular: Normal rate, regular rhythm, normal heart sounds and intact distal pulses.  Exam reveals no gallop and no friction rub.   No murmur heard. HR is 88.   Pulmonary/Chest: Effort normal and breath sounds normal. No respiratory distress. He has no wheezes. He has no rales.  Lungs are clear to auscultation bilaterally.  Abdominal: Soft. Bowel sounds are normal. He exhibits no distension. There is no tenderness. There is no rebound and no guarding.  Abdomen is soft and nontender to palpation. Bowel sounds are present.  Musculoskeletal: He exhibits no edema.  No midline back tenderness. No lower extremity edema or tenderness.  Lymphadenopathy:    He has no cervical adenopathy.  Neurological: He is alert and oriented to person, place, and time. No cranial nerve deficit. Coordination normal.  Patient is spontaneously moving all extremities in a coordinated fashion exhibiting good strength. Cranial nerves are intact. EOMs intact bilaterally. Sensation is intact in his bilateral upper and lower extremities. Equal grip strengths bilaterally. The patient is able to ambulate without difficulty or assistance.  Skin: Skin is warm and dry. No rash noted. He is not diaphoretic. No erythema. No pallor.  Psychiatric: He has a normal mood and affect. His behavior is normal.  Nursing note and vitals reviewed.   ED Course  Procedures (including critical care time) Labs Review Labs Reviewed  BASIC METABOLIC PANEL  CBC WITH DIFFERENTIAL/PLATELET    Imaging Review Ct Head Wo Contrast  07/26/2014   CLINICAL DATA:  Headache for 2 days going into the neck.  EXAM: CT HEAD WITHOUT CONTRAST  TECHNIQUE: Contiguous axial images were obtained from the base of the skull through the vertex without intravenous contrast.  COMPARISON:  Head CT scan 09/08/2013.  FINDINGS:  The brain appears normal without hemorrhage, infarct, mass lesion, mass effect, midline shift or abnormal extra-axial fluid collection. No hydrocephalus or pneumocephalus. The calvarium is intact. There appears to be a tiny mucous retention cyst or polyp in the posterior right maxillary sinus.  IMPRESSION: Negative examination.   Electronically Signed   By: Drusilla Kanner M.D.   On: 07/26/2014 12:47     EKG Interpretation None      Filed Vitals:   07/26/14 1145 07/26/14 1200 07/26/14 1230 07/26/14 1300  BP: 113/75 116/74 94/55 108/86  Pulse: 93 77 72 79  Temp:      TempSrc:      Resp: 21 13 17 15   Height:      Weight:      SpO2: 98% 98% 97% 97%     MDM   Meds given in ED:  Medications  iohexol (OMNIPAQUE) 350 MG/ML injection 50 mL (not administered)  sodium chloride 0.9 % bolus 1,000 mL (1,000 mLs Intravenous New Bag/Given 07/26/14 1142)  metoCLOPramide (REGLAN) injection 10 mg (10 mg Intravenous Given 07/26/14 1157)  diphenhydrAMINE (BENADRYL) injection 25 mg (25 mg Intravenous Given 07/26/14 1157)    Discharge Medication List as of 07/26/2014  1:51 PM      Final diagnoses:  Bad headache   This is a 34 y.o. male who is otherwise healthy who presents to the ED complaining of a  posterior headache and neck pain for the past two days. Patient currently complains of in 6 out of 10 posterior headache with associated neck pain but has gradually worsened over the past 2 days. He reports associated photophobia with nausea and vomiting. She denies any current nausea reports he vomited twice earlier today.  On exam the patient is afebrile and nontoxic appearing. He has no focal neurological deficits. He does have a positive Kernig's sign but a negative Brudzinski sign. Plan is for fluid bolus, Reglan and Benadryl as well as CT of his head. CT of his head was unremarkable. BMP and CBC are within normal limits. To the patient's posterior headache and positive Kernig's sign plan is for CT imaging of  his head and neck to rule out vertebral artery aneurysm. I discussed this plan with the patient and he agreed. He also informed me that his headache is down to a 2 out of 10 and he is feeling much better. About 30 minutes later I was informed that the patient could not be found. According to nursing staff he got dressed, unhooked his IV line and stated he needed to use the bathroom. He was walked down to the bathroom, but never returned. Staff report he still had his IV in. [employer] and security were notified. I also looked for the patient. I called the patient on his cell phone 3 times and left a message. I also called his significant other who reported wrong number. I also called his work and left a message to return to the ED. We are unable to contact the patient and at this point he is considered AMA.    Everlene FarrierWilliam Laurisa Sahakian, PA-C 07/26/14 1607  Gwyneth SproutWhitney Plunkett, MD 07/29/14 [phone]

## 2014-07-26 NOTE — ED Notes (Signed)
Pt states he started having a headache two days ago. States pain is in back of head and goes down his neck. Pt is sensitive to light, nauseous and has been vomiting.

## 2014-07-26 NOTE — ED Notes (Signed)
Pt is no where in department. [employer] notified. Cannot find pt. Pt left sunglasses and still has IV in arm. Security, staff and [employer] looking for pt

## 2014-07-26 NOTE — ED Notes (Signed)
Walked into pt's room and IV fluids flowing onto floor. Pt holding IV line stating he didn't know what happened- he was sleeping and IV fluids just came unhooked from IV. RN helped him hook line back up.
